# Patient Record
Sex: Male | Born: 1985 | Race: Black or African American | Hispanic: No | Marital: Single | Smoking: Former smoker
Health system: Southern US, Community
[De-identification: ages and names within clinical notes are randomized; demographics above are authoritative.]

## PROBLEM LIST (undated history)

## (undated) ENCOUNTER — Emergency Department (HOSPITAL_COMMUNITY): Payer: No Typology Code available for payment source | Source: Home / Self Care

## (undated) DIAGNOSIS — F29 Unspecified psychosis not due to a substance or known physiological condition: Secondary | ICD-10-CM

---

## 2008-03-19 ENCOUNTER — Emergency Department (HOSPITAL_COMMUNITY): Admission: EM | Admit: 2008-03-19 | Discharge: 2008-03-19 | Payer: Self-pay | Admitting: Emergency Medicine

## 2008-11-25 ENCOUNTER — Ambulatory Visit: Payer: Self-pay | Admitting: Diagnostic Radiology

## 2008-11-25 ENCOUNTER — Emergency Department (HOSPITAL_BASED_OUTPATIENT_CLINIC_OR_DEPARTMENT_OTHER): Admission: EM | Admit: 2008-11-25 | Discharge: 2008-11-25 | Payer: Self-pay | Admitting: Emergency Medicine

## 2011-02-23 LAB — COMPREHENSIVE METABOLIC PANEL
ALT: 6 U/L (ref 0–53)
AST: 23 U/L (ref 0–37)
Albumin: 4.8 g/dL (ref 3.5–5.2)
Alkaline Phosphatase: 44 U/L (ref 39–117)
Calcium: 9.1 mg/dL (ref 8.4–10.5)
GFR calc Af Amer: >60 mL/min (ref >60)
Glucose, Bld: 95 mg/dL (ref 70–99)
Potassium: 4.2 mEq/L (ref 3.5–5.1)
Sodium: 141 mEq/L (ref 135–145)
Total Protein: 7.8 g/dL (ref 6.0–8.3)

## 2011-02-23 LAB — DIFFERENTIAL
Basophils Relative: 3 % — ABNORMAL HIGH (ref 0–1)
Eosinophils Absolute: 0.1 10*3/uL (ref 0.0–0.7)
Eosinophils Relative: 1 % (ref 0–5)
Lymphs Abs: 2.5 10*3/uL (ref 0.7–4.0)
Monocytes Absolute: 0.5 10*3/uL (ref 0.1–1.0)
Monocytes Relative: 6 % (ref 3–12)

## 2011-02-23 LAB — CBC
Hemoglobin: 14.4 g/dL (ref 13.0–17.0)
Platelets: 253 10*3/uL (ref 150–400)
RDW: 12.9 % (ref 11.5–15.5)

## 2011-02-23 LAB — POCT TOXICOLOGY PANEL: Tetrahydrocannabinol: POSITIVE

## 2011-02-23 LAB — URINALYSIS, ROUTINE W REFLEX MICROSCOPIC
Bilirubin Urine: NEGATIVE
Glucose, UA: NEGATIVE mg/dL
Protein, ur: NEGATIVE mg/dL
Urobilinogen, UA: 0.2 mg/dL (ref 0.0–1.0)

## 2011-02-23 LAB — URINE MICROSCOPIC-ADD ON

## 2011-02-23 LAB — ETHANOL: Alcohol, Ethyl (B): 176 mg/dL — ABNORMAL HIGH (ref 0–10)

## 2014-01-20 ENCOUNTER — Emergency Department (HOSPITAL_COMMUNITY)
Admission: EM | Admit: 2014-01-20 | Discharge: 2014-01-21 | Disposition: A | Discharge status: Home / Self Care | Payer: Self-pay | Attending: Emergency Medicine | Admitting: Emergency Medicine

## 2014-01-20 ENCOUNTER — Encounter (HOSPITAL_COMMUNITY): Payer: Self-pay | Admitting: Emergency Medicine

## 2014-01-20 DIAGNOSIS — Z202 Contact with and (suspected) exposure to infections with a predominantly sexual mode of transmission: Secondary | ICD-10-CM

## 2014-01-20 DIAGNOSIS — Z113 Encounter for screening for infections with a predominantly sexual mode of transmission: Secondary | ICD-10-CM | POA: Insufficient documentation

## 2014-01-20 DIAGNOSIS — L539 Erythematous condition, unspecified: Secondary | ICD-10-CM | POA: Insufficient documentation

## 2014-01-20 MED ORDER — METRONIDAZOLE 500 MG PO TABS: 2000.0000 mg | ORAL_TABLET | Freq: Once | ORAL | Status: DC

## 2014-01-20 MED ORDER — AZITHROMYCIN 250 MG PO TABS
1000.0000 mg | ORAL_TABLET | Freq: Once | ORAL | Status: AC
  Administered 2014-01-21: 1000 mg via ORAL
  Filled 2014-01-20: qty 4

## 2014-01-20 MED ORDER — CEFTRIAXONE SODIUM 250 MG IJ SOLR
250.0000 mg | Freq: Once | INTRAMUSCULAR | Status: AC
  Administered 2014-01-21: 250 mg via INTRAMUSCULAR
  Filled 2014-01-20: qty 250
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
## 2014-01-20 NOTE — Discharge Instructions (Signed)
You were seen and evaluated for possible STD exposure. Your lab testing will be back in 2-3 days. You were given treatments for gonorrhea, Chlamydia and a prescription for medicine to treat possible trichomonas. Please take the antibiotics at home for possible trichomonas as instructed by taking all the pills once. Do not drink alcohol with this medicine. Followup with [facility] for continued evaluation and treatment.    Sexually Transmitted Disease A sexually transmitted disease (STD) is a disease or infection that may be passed (transmitted) from person to person, usually during sexual activity. This may happen by way of saliva, semen, blood, vaginal mucus, or urine. Common STDs include:   Gonorrhea.   Chlamydia.   Syphilis.   HIV and AIDS.   Genital herpes.   Hepatitis B and C.   Trichomonas.   Human papillomavirus (HPV).   Pubic lice.   Scabies.  Mites.  Bacterial vaginosis. WHAT ARE CAUSES OF STDs? An STD may be caused by bacteria, a virus, or parasites. STDs are often transmitted during sexual activity if one person is infected. However, they may also be transmitted through nonsexual means. STDs may be transmitted after:   Sexual intercourse with an infected person.   Sharing sex toys with an infected person.   Sharing needles with an infected person or using unclean piercing or tattoo needles.  Having intimate contact with the genitals, mouth, or rectal areas of an infected person.   Exposure to infected fluids during birth. WHAT ARE THE SIGNS AND SYMPTOMS OF STDs? Different STDs have different symptoms. Some people may not have any symptoms. If symptoms are present, they may include:   Painful or bloody urination.   Pain in the pelvis, abdomen, vagina, anus, throat, or eyes.   Skin rash, itching, irritation, growths, sores (lesions), ulcerations, or warts in the genital or anal area.  Abnormal vaginal discharge with or  without bad odor.   Penile discharge in men.   Fever.   Pain or bleeding during sexual intercourse.   Swollen glands in the groin area.   Yellow skin and eyes (jaundice). This is seen with hepatitis.   Swollen testicles.  Infertility.  Sores and blisters in the mouth. HOW ARE STDs DIAGNOSED? To make a diagnosis, your health care provider may:   Take a medical history.   Perform a physical exam.   Take a sample of any discharge for examination.  Swab the throat, cervix, opening to the penis, rectum, or vagina for testing.  Test a sample of your first morning urine.   Perform blood tests.   Perform a Pap smear, if this applies.   Perform a colposcopy.   Perform a laparoscopy.  HOW ARE STDs TREATED? Treatment depends on the STD. Some STDs may be treated but not cured.   Chlamydia, gonorrhea, trichomonas, and syphilis can be cured with antibiotics.   Genital herpes, hepatitis, and HIV can be treated, but not cured, with prescribed medicines. The medicines lessen symptoms.   Genital warts from HPV can be treated with medicine or by freezing, burning (electrocautery), or surgery. Warts may come back.   HPV cannot be cured with medicine or surgery. However, abnormal areas may be removed from the cervix, vagina, or vulva.   If your diagnosis is confirmed, your recent sexual partners need treatment. This is true even if they are symptom-free or have a negative culture or evaluation. They should not have sex until their health care providers say it is OK. HOW CAN I REDUCE  MY RISK OF GETTING AN STD?  Use latex condoms, dental dams, and water-soluble lubricants during sexual activity. Do not use petroleum jelly or oils.  Get vaccinated for HPV and hepatitis. If you have not received these vaccines in the past, talk to your health care provider about whether one or both might be right for you.   Avoid risky sex practices that can break the skin.  WHAT  SHOULD I DO IF I THINK I HAVE AN STD?  See your health care provider.   Inform all sexual partners. They should be tested and treated for any STDs.  Do not have sex until your health care provider says it is OK. WHEN SHOULD I GET HELP? Seek immediate medical care if:  You develop severe abdominal pain.  You are a man and notice swelling or pain in the testicles.  You are a woman and notice swelling or pain in your vagina. Document Released: 01/16/2003 Document Revised: 08/16/2013 Document Reviewed: 05/16/2013 [facility] Patient Information 2014 [place], [place].

## 2014-01-22 NOTE — ED Provider Notes (Signed)
Medical screening examination/treatment/procedure(s) were performed by non-physician practitioner and as supervising physician I was immediately available for consultation/collaboration.   EKG Interpretation None        Suzi RootsKevin E Quintavius Niebuhr, MD 01/22/14 [phone]

## 2014-01-25 LAB — GC/CHLAMYDIA PROBE AMP
CT Probe RNA: POSITIVE — AB
GC PROBE AMP APTIMA: POSITIVE — AB

## 2014-01-26 NOTE — ED Notes (Signed)
+   Chlamydia + Gonorrhea Patient treated with Rocephin And Zithromax-[facility] faxed 

## 2014-01-27 ENCOUNTER — Telehealth (HOSPITAL_BASED_OUTPATIENT_CLINIC_OR_DEPARTMENT_OTHER): Payer: Self-pay | Admitting: Emergency Medicine

## 2014-01-30 NOTE — ED Notes (Signed)
Unable to contact via phone.'Letter sent to [facility] address. 

## 2014-04-30 ENCOUNTER — Emergency Department (HOSPITAL_BASED_OUTPATIENT_CLINIC_OR_DEPARTMENT_OTHER)
Admission: EM | Admit: 2014-04-30 | Discharge: 2014-04-30 | Disposition: A | Discharge status: Home / Self Care | Payer: Self-pay | Attending: Emergency Medicine | Admitting: Emergency Medicine

## 2014-04-30 ENCOUNTER — Encounter (HOSPITAL_BASED_OUTPATIENT_CLINIC_OR_DEPARTMENT_OTHER): Payer: Self-pay | Admitting: Emergency Medicine

## 2014-04-30 DIAGNOSIS — S81811A Laceration without foreign body, right lower leg, initial encounter: Secondary | ICD-10-CM

## 2014-04-30 DIAGNOSIS — Z23 Encounter for immunization: Secondary | ICD-10-CM | POA: Insufficient documentation

## 2014-04-30 DIAGNOSIS — Y9389 Activity, other specified: Secondary | ICD-10-CM | POA: Insufficient documentation

## 2014-04-30 DIAGNOSIS — S81809A Unspecified open wound, unspecified lower leg, initial encounter: Principal | ICD-10-CM

## 2014-04-30 DIAGNOSIS — Y9289 Other specified places as the place of occurrence of the external cause: Secondary | ICD-10-CM | POA: Insufficient documentation

## 2014-04-30 DIAGNOSIS — W261XXA Contact with sword or dagger, initial encounter: Secondary | ICD-10-CM

## 2014-04-30 DIAGNOSIS — S81009A Unspecified open wound, unspecified knee, initial encounter: Secondary | ICD-10-CM | POA: Insufficient documentation

## 2014-04-30 DIAGNOSIS — W260XXA Contact with knife, initial encounter: Secondary | ICD-10-CM | POA: Insufficient documentation

## 2014-04-30 DIAGNOSIS — S91009A Unspecified open wound, unspecified ankle, initial encounter: Principal | ICD-10-CM

## 2014-04-30 MED ORDER — TETANUS-DIPHTH-ACELL PERTUSSIS 5-2.5-18.5 LF-MCG/0.5 IM SUSP
0.5000 mL | Freq: Once | INTRAMUSCULAR | Status: AC
  Administered 2014-04-30: 0.5 mL via INTRAMUSCULAR
  Filled 2014-04-30: qty 0.5
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
## 2014-04-30 NOTE — ED Provider Notes (Signed)
Medical screening examination/treatment/procedure(s) were performed by non-physician practitioner and as supervising physician I was immediately available for consultation/collaboration.    Robert L Beaton, MD 04/30/14 1323 

## 2014-04-30 NOTE — ED Notes (Signed)
Pt upset that we would not write him out of work for a couple of days.  EDP spoke with pt as well as Charity fundraiserN.  Pt reports that he is going to a different hospital to be treated.

## 2014-04-30 NOTE — Discharge Instructions (Signed)
Delayed Wound Closure °Sometimes, your health care provider will decide to delay closing a wound for several days. This is done when the wound is badly bruised, dirty, or when it has been several hours since the injury happened. By delaying the closure of your wound, the risk of infection is reduced. Wounds that are closed in 3-7 days after being cleaned up and dressed heal just as well as those that are closed right away. °HOME CARE INSTRUCTIONS °· Rest and elevate the injured area until the pain and swelling are gone. °· Have your wound checked as instructed by your health care provider. °SEEK MEDICAL CARE IF: °· You develop unusual or increased swelling or redness around the wound. °· You have increasing pain or tenderness. °· There is increasing fluid (drainage) or a bad smelling drainage coming from the wound. °Document Released: 10/26/2005 Document Revised: 10/31/2013 Document Reviewed: 04/25/2013 °ExitCare® Patient Information ©2015 [employer], LLC. This information is not intended to replace advice given to you by your health care provider. Make sure you discuss any questions you have with your health care provider. ° °

## 2014-10-13 ENCOUNTER — Encounter (HOSPITAL_BASED_OUTPATIENT_CLINIC_OR_DEPARTMENT_OTHER): Payer: Self-pay | Admitting: Emergency Medicine

## 2014-10-13 ENCOUNTER — Emergency Department (HOSPITAL_BASED_OUTPATIENT_CLINIC_OR_DEPARTMENT_OTHER)
Admission: EM | Admit: 2014-10-13 | Discharge: 2014-10-13 | Disposition: A | Discharge status: Home / Self Care | Payer: Self-pay | Attending: Emergency Medicine | Admitting: Emergency Medicine

## 2014-10-13 DIAGNOSIS — Z792 Long term (current) use of antibiotics: Secondary | ICD-10-CM | POA: Insufficient documentation

## 2014-10-13 DIAGNOSIS — Z202 Contact with and (suspected) exposure to infections with a predominantly sexual mode of transmission: Secondary | ICD-10-CM | POA: Insufficient documentation

## 2014-10-13 MED ORDER — AZITHROMYCIN 250 MG PO TABS
1000.0000 mg | ORAL_TABLET | Freq: Once | ORAL | Status: AC
  Administered 2014-10-13: 1000 mg via ORAL
  Filled 2014-10-13: qty 4

## 2014-10-13 MED ORDER — LIDOCAINE HCL (PF) 1 % IJ SOLN
INTRAMUSCULAR | Status: AC
  Administered 2014-10-13: 14:00:00
  Filled 2014-10-13: qty 5

## 2014-10-13 MED ORDER — CEFTRIAXONE SODIUM 250 MG IJ SOLR
250.0000 mg | Freq: Once | INTRAMUSCULAR | Status: AC
  Administered 2014-10-13: 250 mg via INTRAMUSCULAR
  Filled 2014-10-13: qty 250

## 2014-10-13 NOTE — ED Provider Notes (Signed)
CSN: [id number]     Arrival date & time 10/13/14  1309 History   First MD Initiated Contact with Patient 10/13/14 1311     Chief Complaint  Patient presents with  . Exposure to STD     (Consider location/radiation/quality/duration/timing/severity/associated sxs/prior Treatment) HPI Comments: Patient presents requesting treatment for gonorrhea. Patient states that his girlfriend/sexual partner was diagnosed with gonorrhea after a routine exam. He denies any symptoms including penile discharge, dysuria, rash, sore throat. No other complaints.  Patient is a 28 y.o. male presenting with STD exposure. The history is provided by the patient.  Exposure to STD Pertinent negatives include no arthralgias, fever, rash or sore throat.    History reviewed. No pertinent past medical history. History reviewed. No pertinent past surgical history. No family history on file. History  Substance Use Topics  . Smoking status: Never Smoker   . Smokeless tobacco: Not on file  . Alcohol Use: No    Review of Systems  Constitutional: Negative for fever.  HENT: Negative for sore throat.   Eyes: Negative for discharge.  Gastrointestinal: Negative for rectal pain.  Genitourinary: Negative for dysuria, frequency, discharge, genital sores, penile pain and testicular pain.  Musculoskeletal: Negative for arthralgias.  Skin: Negative for rash.  Hematological: Negative for adenopathy.      Allergies  Review of patient's allergies indicates no known allergies.  Home Medications   Prior to Admission medications   Medication Sig Start Date End Date Taking? Authorizing Provider  ibuprofen (ADVIL,MOTRIN) 200 MG tablet Take 400 mg by mouth every 6 (six) hours as needed for fever or moderate pain.    Historical Provider, MD  metroNIDAZOLE (FLAGYL) 500 MG tablet Take 4 tablets (2,000 mg total) by mouth once. 01/20/14   Phill MutterPeter S Dammen, PA-C   BP 121/83 mmHg  Pulse 97  Temp(Src) 97.7 F (36.5 C) (Oral)  Resp  18  Ht 5\' 10"  (1.778 m)  Wt 165 lb (74.844 kg)  BMI 23.68 kg/m2  SpO2 100%   Physical Exam  Constitutional: He appears well-developed and well-nourished.  HENT:  Head: Normocephalic and atraumatic.  Eyes: Conjunctivae are normal.  Neck: Normal range of motion. Neck supple.  Pulmonary/Chest: No respiratory distress.  Genitourinary: Penis normal. Right testis shows no tenderness. Left testis shows no tenderness. No discharge found.  Lymphadenopathy:       Right: No inguinal adenopathy present.       Left: No inguinal adenopathy present.  Neurological: He is alert.  Skin: Skin is warm and dry.  Psychiatric: He has a normal mood and affect.  Nursing note and vitals reviewed.   ED Course  Procedures (including critical care time) Labs Review Labs Reviewed  GC/CHLAMYDIA PROBE AMP    Imaging Review No results found.   EKG Interpretation None       1:52 PM Patient seen and examined. Work-up initiated. Medications ordered. Patient declines RPR and HIV testing.  Vital signs reviewed and are as follows: BP 121/83 mmHg  Pulse 97  Temp(Src) 97.7 F (36.5 C) (Oral)  Resp 18  Ht 5\' 10"  (1.778 m)  Wt 165 lb (74.844 kg)  BMI 23.68 kg/m2  SpO2 100%  Will test and treat for STD exposure. Patient counseled on safe sexual practices. Told them that they should not have sexual contact for next 7 days and that they need to inform sexual partners so that they can get tested and treated as well. Urged f/u with [place] [facility] for HIV and syphilis testing.  Patient verbalizes understanding and agrees with plan.     MDM   Final diagnoses:  Exposure to STD   Patient with exposure to gonorrhea. Urine probe sent. Patient treated with Rocephin and azithromycin.    Renne CriglerJoshua Lakota Markgraf, PA-C 10/13/14 1355  Audree CamelScott T Goldston, MD 10/14/14 [phone]

## 2014-10-13 NOTE — ED Notes (Signed)
PT presents to ED states his girlfriend was diagnosed  with gonorrhea and he wants to get treated.

## 2014-10-13 NOTE — Discharge Instructions (Signed)
Please read and follow all provided instructions.  Your diagnoses today include:  1. Exposure to STD     Tests performed today include:  Test for gonorrhea and chlamydia. You will be notified by telephone if you have a positive result.  Vital signs. See below for your results today.   Medications:  You were treated for chlamydia (1 gram azithromycin pills) and gonorrhea (250mg  rocephin shot).  Home care instructions:  Read educational materials contained in this packet and follow any instructions provided.   You should tell your partners about your infection and avoid having sex for one week to allow time for the medicine to work.  Follow-up instructions: You [facility] to be tested for HIV, syphilis, and hepatitis -- all of which can be transmitted by sexual contact. We do not routinely screen for these in the Emergency Department.  STD Testing:  [facility] [place] [place] [facility]  [address], [place], phone [phone] or [phone]    Monday - Friday, call for an appointment  [facility] [place] [place] [facility]  [address], [place], phone [phone] or [phone]   Monday - Friday, call for an appointment  Return instructions:   Please return to the Emergency Department if you experience worsening symptoms.   Please return if you have any other emergent concerns.  Additional Information:  Your vital signs today were: BP 121/83 mmHg   Pulse 97   Temp(Src) 97.7 F (36.5 C) (Oral)   Resp 18   Ht 5\' 10"  (1.778 m)   Wt 165 lb (74.844 kg)   BMI 23.68 kg/m2   SpO2 100% If your blood pressure (BP) was elevated above 135/85 this visit, please have this repeated by your doctor within one month. --------------

## 2014-10-13 NOTE — ED Notes (Signed)
Additional orders placed per lab request

## 2014-10-19 LAB — NEISSERIA GONORRHOEAE, PROBE AMP: GC Probe RNA: POSITIVE — AB

## 2014-10-19 LAB — CHLAMYDIA TRACHOMATIS, PROBE AMP: CT Probe RNA: POSITIVE — AB

## 2014-10-20 ENCOUNTER — Telehealth: Payer: Self-pay | Admitting: *Deleted

## 2014-11-04 ENCOUNTER — Telehealth (HOSPITAL_COMMUNITY): Payer: Self-pay

## 2014-11-04 NOTE — ED Notes (Signed)
Unable to contact pt by mail or telephone. Unable to communicate lab results or treatment changes. 

## 2014-11-13 ENCOUNTER — Telehealth (HOSPITAL_BASED_OUTPATIENT_CLINIC_OR_DEPARTMENT_OTHER): Payer: Self-pay | Admitting: Emergency Medicine

## 2014-11-13 NOTE — Telephone Encounter (Signed)
Patient lost to followup, letter sent unable to forward

## 2016-05-22 ENCOUNTER — Emergency Department (HOSPITAL_BASED_OUTPATIENT_CLINIC_OR_DEPARTMENT_OTHER)
Admission: EM | Admit: 2016-05-22 | Discharge: 2016-05-22 | Disposition: A | Discharge status: Home / Self Care | Payer: Self-pay | Attending: Emergency Medicine | Admitting: Emergency Medicine

## 2016-05-22 ENCOUNTER — Encounter (HOSPITAL_BASED_OUTPATIENT_CLINIC_OR_DEPARTMENT_OTHER): Payer: Self-pay | Admitting: *Deleted

## 2016-05-22 DIAGNOSIS — Z202 Contact with and (suspected) exposure to infections with a predominantly sexual mode of transmission: Secondary | ICD-10-CM | POA: Insufficient documentation

## 2016-05-22 LAB — URINE MICROSCOPIC-ADD ON: Squamous Epithelial / LPF: NONE SEEN

## 2016-05-22 LAB — URINALYSIS, ROUTINE W REFLEX MICROSCOPIC
Bilirubin Urine: NEGATIVE
GLUCOSE, UA: NEGATIVE mg/dL
HGB URINE DIPSTICK: NEGATIVE
Ketones, ur: NEGATIVE mg/dL
Nitrite: NEGATIVE
Protein, ur: NEGATIVE mg/dL
SPECIFIC GRAVITY, URINE: 1.022 (ref 1.005–1.030)
pH: 6 (ref 5.0–8.0)

## 2016-05-22 MED ORDER — AZITHROMYCIN 250 MG PO TABS
1000.0000 mg | ORAL_TABLET | Freq: Once | ORAL | Status: AC
  Administered 2016-05-22: 1000 mg via ORAL
  Filled 2016-05-22: qty 4

## 2016-05-22 MED ORDER — CEFTRIAXONE SODIUM 250 MG IJ SOLR
250.0000 mg | Freq: Once | INTRAMUSCULAR | Status: AC
  Administered 2016-05-22: 250 mg via INTRAMUSCULAR
  Filled 2016-05-22: qty 250

## 2016-05-22 MED ORDER — LIDOCAINE HCL (PF) 1 % IJ SOLN
INTRAMUSCULAR | Status: AC
  Administered 2016-05-22: 5 mL
  Filled 2016-05-22: qty 5

## 2016-05-22 NOTE — ED Provider Notes (Signed)
CSN: [id number]     Arrival date & time 05/22/16  1604 History  By signing my name below, I, Soijett Blue, attest that this documentation has been prepared under the direction and in the presence of Melburn Hake, PA-C Electronically Signed: Soijett Blue, ED Scribe. 05/22/2016. 6:27 PM.   No chief complaint on file.    The history is provided by the patient. No language interpreter was used.    Derrick Shepherd is a 30 y.o. male who presents to the Emergency Department complaining of exposure to STD onset today. Denies any pain or complaints at this time. Pt notes that his girlfriend tested positive for gonorrhea and informed him today that he would need to be tested. Pt reports that he has 2 current sexual partners and he has informed his other sexual partner so that she can be tested as well. Pt notes that he intermittently uses condoms during sexual intercourse. Denies any known sexual contact with a HIV infected individual or other recent STD exposure.  He denies penile discharge/pain/swelling, testicular pain/swelling, abdominal pain, n/v, dysuria, hematuria, rash, and any other symptoms. Denies allergies to any medications.    History reviewed. No pertinent past medical history. No past surgical history on file. No family history on file. Social History  Substance Use Topics  . Smoking status: Never Smoker   . Smokeless tobacco: None  . Alcohol Use: No    Review of Systems  Gastrointestinal: Negative for nausea, vomiting and abdominal pain.  Genitourinary: Negative for dysuria, hematuria, discharge, penile swelling, scrotal swelling, difficulty urinating, genital sores, penile pain and testicular pain.  Skin: Negative for color change and rash.     Allergies  Review of patient's allergies indicates no known allergies.  Home Medications   Prior to Admission medications   Medication Sig Start Date End Date Taking? Authorizing Provider  ibuprofen (ADVIL,MOTRIN) 200 MG tablet Take  400 mg by mouth every 6 (six) hours as needed for fever or moderate pain.    Historical Provider, MD  metroNIDAZOLE (FLAGYL) 500 MG tablet Take 4 tablets (2,000 mg total) by mouth once. 01/20/14   Peter Dammen, PA-C   BP 121/71 mmHg  Pulse 71  Temp(Src) 98.4 F (36.9 C) (Oral)  Resp 16  Ht  (1.778 m)  Wt 77.111 kg  BMI 24.39 kg/m2  SpO2 99% Physical Exam  Constitutional: He is oriented to person, place, and time. He appears well-developed and well-nourished.  HENT:  Head: Normocephalic and atraumatic.  Mouth/Throat: Oropharynx is clear and moist. No oropharyngeal exudate.  Eyes: Conjunctivae and EOM are normal. Right eye exhibits no discharge. Left eye exhibits no discharge. No scleral icterus.  Neck: Normal range of motion. Neck supple.  Cardiovascular: Normal rate, regular rhythm and normal heart sounds.  Exam reveals no gallop and no friction rub.   No murmur heard. Pulmonary/Chest: Effort normal and breath sounds normal. No respiratory distress. He has no wheezes. He has no rales. He exhibits no tenderness.  Abdominal: Soft. He exhibits no distension. There is no tenderness. Hernia confirmed negative in the right inguinal area and confirmed negative in the left inguinal area.  Genitourinary: Testes normal and penis normal. Right testis shows no mass, no swelling and no tenderness. Left testis shows no mass, no swelling and no tenderness. Circumcised. No phimosis, paraphimosis, hypospadias, penile erythema or penile tenderness. No discharge found.  Chaperone present for [id number]: Normal range of motion. He exhibits no edema.  Lymphadenopathy:  Right: No inguinal adenopathy present.       Left: No inguinal adenopathy present.  Neurological: He is alert and oriented to person, place, and time.  Nursing note and vitals reviewed.   ED Course  Procedures (including critical care time) DIAGNOSTIC STUDIES: Oxygen Saturation is 99% on RA, nl by my interpretation.     COORDINATION OF CARE: 6:23 PM Discussed treatment plan with pt at bedside which includes rocephin and zithromax and pt agreed to plan.  6:21 PM-Recommended HIV antibody, GC/chlamydia probe, and RPR testing to which the pt declined. Pt informed of the risk factors, to which he states that he is aware of the risk factors.  Labs Review Labs Reviewed  URINALYSIS, ROUTINE W REFLEX MICROSCOPIC (NOT AT [facility]) - Abnormal; Notable for the following:    Leukocytes, UA SMALL (*)    All other components within normal limits  URINE MICROSCOPIC-ADD ON - Abnormal; Notable for the following:    Bacteria, UA RARE (*)    All other components within normal limits  GC/CHLAMYDIA PROBE AMP ([place]) NOT AT [facility]    I have personally reviewed and evaluated these lab results as part of my medical decision-making.  MDM   Final diagnoses:  Exposure to STD    Patient treated in the ED for STI with rocephin and zithromax. Patient advised to inform and treat all sexual partners. Pt advised on safe sex practices and understands that they have GC/Chlamydia cultures pending and will result in 2-3 days. Recommended HIV antibody and RPR testing to which the pt declined. HIV and RPR not sent due to pt refusing. Pt informed of the risk factors, to which he states that he is aware of the risk factors and repeatedly declined testing. UA unremarkable. Pt encouraged to follow up at local health department for future STI checks. No concern for prostatitis or epididymitis. Discussed return precautions. Pt appears safe for discharge.   I personally performed the services described in this documentation, which was scribed in my presence. The recorded information has been reviewed and is accurate.    Satira Sarkicole Elizabeth Light OakNadeau, [place] JerseyPA-C 05/22/16 1840  Vanetta MuldersScott Zackowski, MD 05/22/16 [id number]

## 2016-05-22 NOTE — Discharge Instructions (Signed)
1. Medications: usual home medications °2. Treatment: rest, drink plenty of fluids, use a condom with every sexual encounter °3. Follow Up: Please followup with your primary doctor in 3 days for discussion of your diagnoses and further evaluation after today's visit; if you do not have a primary care doctor use the resource guide provided to find one; Please return to the ER for worsening symptoms, high fevers or persistent vomiting. ° °You have been tested for HIV, syphilis, chlamydia and gonorrhea.  These results will be available in approximately 3 days.  Please inform all sexual partners if you test positive for any of these diseases.  ° °[facility] [place] [facility] tests for every STD. There are 9 other clinics in [place], but only 3 of them offer such comprehensive testing. ° °Services Offered: °Testing Services  °Chlamydia Testing °Conventional Blood HIV Testing °Gonorrhea Testing °Hepatitis B Testing °Hepatitis C Testing °Herpes Testing °Syphilis Testing °TB Testing ° °Vaccines And Treatments:  °Hepatitis B Vaccine °Hepatitis A Vaccine °HPV Vaccine °TB Treatment °Gynecological Care °Family Planning ° °Prevention Services:  °HIV Test Counseling °HIV/AIDS Prevention Education °Safer Sex Education °[employer] [place] °STD Prevention/Education °TB Prevention Education ° °Languages Spoken: °English °Spanish ° °Hours of Operation: °Note: Please contact the organization for hours of operation. Disclaimer: Hours of peration change frequently. Please contact the organization to verify. °Appointment Required: Yes ° °Contact Information: °Phone [phone] °Other Phones [phone] (Domestic Fax) ° °Address: °[address] °, [place] [address] ° °Website: [email or web address]  °

## 2016-05-22 NOTE — ED Notes (Signed)
Std exposure. No symptoms.

## 2016-05-25 LAB — GC/CHLAMYDIA PROBE AMP (~~LOC~~) NOT AT ARMC
CHLAMYDIA, DNA PROBE: POSITIVE — AB
NEISSERIA GONORRHEA: POSITIVE — AB

## 2016-05-26 ENCOUNTER — Telehealth (HOSPITAL_BASED_OUTPATIENT_CLINIC_OR_DEPARTMENT_OTHER): Payer: Self-pay

## 2016-05-26 NOTE — Telephone Encounter (Signed)
Spoke with pt. Verified ID. Informed of labs. Treated per protocol. [employer] form faxed. Pt informed to abstain from sexual activity x 10 days and to notify partner for testing and treatment.  

## 2016-08-24 ENCOUNTER — Emergency Department (HOSPITAL_BASED_OUTPATIENT_CLINIC_OR_DEPARTMENT_OTHER): Payer: No Typology Code available for payment source

## 2016-08-24 ENCOUNTER — Encounter (HOSPITAL_BASED_OUTPATIENT_CLINIC_OR_DEPARTMENT_OTHER): Payer: Self-pay

## 2016-08-24 ENCOUNTER — Emergency Department (HOSPITAL_BASED_OUTPATIENT_CLINIC_OR_DEPARTMENT_OTHER)
Admission: EM | Admit: 2016-08-24 | Discharge: 2016-08-24 | Disposition: A | Discharge status: Home / Self Care | Payer: No Typology Code available for payment source | Attending: Emergency Medicine | Admitting: Emergency Medicine

## 2016-08-24 DIAGNOSIS — M62838 Other muscle spasm: Secondary | ICD-10-CM | POA: Insufficient documentation

## 2016-08-24 DIAGNOSIS — M549 Dorsalgia, unspecified: Secondary | ICD-10-CM | POA: Insufficient documentation

## 2016-08-24 DIAGNOSIS — M542 Cervicalgia: Secondary | ICD-10-CM | POA: Diagnosis present

## 2016-08-24 DIAGNOSIS — Y939 Activity, unspecified: Secondary | ICD-10-CM | POA: Diagnosis not present

## 2016-08-24 DIAGNOSIS — Y999 Unspecified external cause status: Secondary | ICD-10-CM | POA: Diagnosis not present

## 2016-08-24 DIAGNOSIS — Y92410 Unspecified street and highway as the place of occurrence of the external cause: Secondary | ICD-10-CM | POA: Insufficient documentation
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
## 2017-10-20 ENCOUNTER — Emergency Department (HOSPITAL_COMMUNITY): Payer: Self-pay

## 2017-10-20 ENCOUNTER — Other Ambulatory Visit: Payer: Self-pay

## 2017-10-20 ENCOUNTER — Encounter (HOSPITAL_COMMUNITY): Payer: Self-pay | Admitting: General Practice

## 2017-10-20 ENCOUNTER — Inpatient Hospital Stay (HOSPITAL_COMMUNITY)
Admission: EM | Admit: 2017-10-20 | Discharge: 2017-10-21 | DRG: 988 | Disposition: A | Discharge status: Home / Self Care | Payer: Self-pay | Attending: Family Medicine | Admitting: Family Medicine

## 2017-10-20 DIAGNOSIS — S80211A Abrasion, right knee, initial encounter: Secondary | ICD-10-CM | POA: Diagnosis present

## 2017-10-20 DIAGNOSIS — G928 Other toxic encephalopathy: Secondary | ICD-10-CM | POA: Diagnosis present

## 2017-10-20 DIAGNOSIS — E872 Acidosis, unspecified: Secondary | ICD-10-CM

## 2017-10-20 DIAGNOSIS — E875 Hyperkalemia: Secondary | ICD-10-CM | POA: Diagnosis present

## 2017-10-20 DIAGNOSIS — S80212A Abrasion, left knee, initial encounter: Secondary | ICD-10-CM | POA: Diagnosis present

## 2017-10-20 DIAGNOSIS — R4182 Altered mental status, unspecified: Secondary | ICD-10-CM

## 2017-10-20 DIAGNOSIS — G92 Toxic encephalopathy: Principal | ICD-10-CM | POA: Diagnosis present

## 2017-10-20 DIAGNOSIS — N179 Acute kidney failure, unspecified: Secondary | ICD-10-CM | POA: Diagnosis present

## 2017-10-20 DIAGNOSIS — T68XXXA Hypothermia, initial encounter: Secondary | ICD-10-CM | POA: Diagnosis present

## 2017-10-20 DIAGNOSIS — D62 Acute posthemorrhagic anemia: Secondary | ICD-10-CM | POA: Diagnosis not present

## 2017-10-20 DIAGNOSIS — W134XXA Fall from, out of or through window, initial encounter: Secondary | ICD-10-CM | POA: Diagnosis present

## 2017-10-20 DIAGNOSIS — T07XXXA Unspecified multiple injuries, initial encounter: Secondary | ICD-10-CM

## 2017-10-20 DIAGNOSIS — F29 Unspecified psychosis not due to a substance or known physiological condition: Secondary | ICD-10-CM

## 2017-10-20 DIAGNOSIS — R410 Disorientation, unspecified: Secondary | ICD-10-CM

## 2017-10-20 DIAGNOSIS — Z23 Encounter for immunization: Secondary | ICD-10-CM

## 2017-10-20 DIAGNOSIS — R404 Transient alteration of awareness: Secondary | ICD-10-CM

## 2017-10-20 HISTORY — DX: Unspecified psychosis not due to a substance or known physiological condition: F29

## 2017-10-20 LAB — I-STAT CHEM 8, ED
BUN: 11 mg/dL (ref 6–20)
CHLORIDE: 106 mmol/L (ref 101–111)
Calcium, Ion: 1.12 mmol/L — ABNORMAL LOW (ref 1.15–1.40)
Creatinine, Ser: 1.1 mg/dL (ref 0.61–1.24)
Glucose, Bld: 227 mg/dL — ABNORMAL HIGH (ref 65–99)
HCT: 42 % (ref 39.0–52.0)
HEMOGLOBIN: 14.3 g/dL (ref 13.0–17.0)
Potassium: 5.9 mmol/L — ABNORMAL HIGH (ref 3.5–5.1)
Sodium: 139 mmol/L (ref 135–145)
TCO2: 19 mmol/L — ABNORMAL LOW (ref 22–32)

## 2017-10-20 LAB — CBC WITH DIFFERENTIAL/PLATELET
BASOS PCT: 0 %
Basophils Absolute: 0 10*3/uL (ref 0.0–0.1)
EOS ABS: 0.1 10*3/uL (ref 0.0–0.7)
EOS PCT: 0 %
HCT: 41.7 % (ref 39.0–52.0)
Hemoglobin: 13.7 g/dL (ref 13.0–17.0)
LYMPHS ABS: 3.4 10*3/uL (ref 0.7–4.0)
Lymphocytes Relative: 24 %
MCH: 29.8 pg (ref 26.0–34.0)
MCHC: 32.9 g/dL (ref 30.0–36.0)
MCV: 90.7 fL (ref 78.0–100.0)
Monocytes Absolute: 1.1 10*3/uL — ABNORMAL HIGH (ref 0.1–1.0)
Monocytes Relative: 8 %
NEUTROS PCT: 68 %
Neutro Abs: 9.5 10*3/uL — ABNORMAL HIGH (ref 1.7–7.7)
PLATELETS: 405 10*3/uL — AB (ref 150–400)
RBC: 4.6 MIL/uL (ref 4.22–5.81)
RDW: 13.2 % (ref 11.5–15.5)
WBC: 14.1 10*3/uL — AB (ref 4.0–10.5)

## 2017-10-20 LAB — COMPREHENSIVE METABOLIC PANEL
ALBUMIN: 4.1 g/dL (ref 3.5–5.0)
ALT: 18 U/L (ref 17–63)
ANION GAP: 17 — AB (ref 5–15)
AST: 50 U/L — ABNORMAL HIGH (ref 15–41)
Alkaline Phosphatase: 49 U/L (ref 38–126)
BUN: 10 mg/dL (ref 6–20)
CALCIUM: 8.7 mg/dL — AB (ref 8.9–10.3)
CHLORIDE: 104 mmol/L (ref 101–111)
CO2: 15 mmol/L — AB (ref 22–32)
CREATININE: 1.33 mg/dL — AB (ref 0.61–1.24)
GFR calc non Af Amer: >60 mL/min (ref >60)
GLUCOSE: 226 mg/dL — AB (ref 65–99)
Potassium: 5.8 mmol/L — ABNORMAL HIGH (ref 3.5–5.1)
Sodium: 136 mmol/L (ref 135–145)
Total Bilirubin: 0.9 mg/dL (ref 0.3–1.2)
Total Protein: 7.1 g/dL (ref 6.5–8.1)

## 2017-10-20 LAB — URINALYSIS, ROUTINE W REFLEX MICROSCOPIC
BACTERIA UA: NONE SEEN
Bilirubin Urine: NEGATIVE
Glucose, UA: 150 mg/dL — AB
Ketones, ur: NEGATIVE mg/dL
Leukocytes, UA: NEGATIVE
Nitrite: NEGATIVE
PH: 5 (ref 5.0–8.0)
Protein, ur: 30 mg/dL — AB
SQUAMOUS EPITHELIAL / LPF: NONE SEEN
Specific Gravity, Urine: 1.014 (ref 1.005–1.030)

## 2017-10-20 LAB — RAPID URINE DRUG SCREEN, HOSP PERFORMED
Amphetamines: NOT DETECTED
Barbiturates: NOT DETECTED
Benzodiazepines: POSITIVE — AB
Cocaine: NOT DETECTED
Opiates: NOT DETECTED
Tetrahydrocannabinol: POSITIVE — AB

## 2017-10-20 LAB — CBG MONITORING, ED: GLUCOSE-CAPILLARY: 225 mg/dL — AB (ref 65–99)

## 2017-10-20 LAB — SALICYLATE LEVEL

## 2017-10-20 LAB — I-STAT CG4 LACTIC ACID, ED
LACTIC ACID, VENOUS: 11.34 mmol/L — AB (ref 0.5–1.9)
LACTIC ACID, VENOUS: 2.05 mmol/L — AB (ref 0.5–1.9)

## 2017-10-20 LAB — ACETAMINOPHEN LEVEL

## 2017-10-20 LAB — ETHANOL

## 2017-10-20 MED ORDER — SODIUM CHLORIDE 0.9 % IV BOLUS (SEPSIS)
1000.0000 mL | Freq: Once | INTRAVENOUS | Status: AC
  Administered 2017-10-20: 1000 mL via INTRAVENOUS

## 2017-10-20 MED ORDER — CEFAZOLIN SODIUM-DEXTROSE 2-4 GM/100ML-% IV SOLN
2.0000 g | Freq: Once | INTRAVENOUS | Status: AC
  Administered 2017-10-20: 2 g via INTRAVENOUS

## 2017-10-20 MED ORDER — SODIUM CHLORIDE 0.9% FLUSH
3.0000 mL | Freq: Two times a day (BID) | INTRAVENOUS | Status: DC
  Administered 2017-10-20: 3 mL via INTRAVENOUS

## 2017-10-20 MED ORDER — MIDAZOLAM HCL 2 MG/2ML IJ SOLN
2.0000 mg | Freq: Once | INTRAMUSCULAR | Status: AC
  Administered 2017-10-20: 2 mg via INTRAVENOUS

## 2017-10-20 MED ORDER — LIDOCAINE HCL 2 % IJ SOLN: 20.0000 mL | Freq: Once | INTRAMUSCULAR | Status: DC

## 2017-10-20 MED ORDER — SODIUM CHLORIDE 0.9% FLUSH: 3.0000 mL | INTRAVENOUS | Status: DC | PRN

## 2017-10-20 MED ORDER — CEFAZOLIN SODIUM-DEXTROSE 2-4 GM/100ML-% IV SOLN
1.0000 g | Freq: Once | INTRAVENOUS | Status: DC
  Filled 2017-10-20: qty 100

## 2017-10-20 MED ORDER — ACETAMINOPHEN 650 MG RE SUPP: 650.0000 mg | Freq: Four times a day (QID) | RECTAL | Status: DC | PRN

## 2017-10-20 MED ORDER — MIDAZOLAM HCL 2 MG/2ML IJ SOLN: 2.0000 mg | Freq: Once | INTRAMUSCULAR | Status: DC | PRN

## 2017-10-20 MED ORDER — CEPHALEXIN 500 MG PO CAPS
500.0000 mg | ORAL_CAPSULE | Freq: Three times a day (TID) | ORAL | Status: DC
  Administered 2017-10-20 – 2017-10-21 (×2): 500 mg via ORAL
  Filled 2017-10-20 (×2): qty 1

## 2017-10-20 MED ORDER — MIDAZOLAM HCL 2 MG/2ML IJ SOLN
INTRAMUSCULAR | Status: AC
  Filled 2017-10-20: qty 2

## 2017-10-20 MED ORDER — TETANUS-DIPHTH-ACELL PERTUSSIS 5-2.5-18.5 LF-MCG/0.5 IM SUSP
0.5000 mL | Freq: Once | INTRAMUSCULAR | Status: AC
  Administered 2017-10-20: 0.5 mL via INTRAMUSCULAR
  Filled 2017-10-20: qty 0.5

## 2017-10-20 MED ORDER — HEPARIN SODIUM (PORCINE) 5000 UNIT/ML IJ SOLN
5000.0000 [IU] | Freq: Three times a day (TID) | INTRAMUSCULAR | Status: DC
  Administered 2017-10-20 – 2017-10-21 (×2): 5000 [IU] via SUBCUTANEOUS
  Filled 2017-10-20 (×2): qty 1

## 2017-10-20 MED ORDER — POLYETHYLENE GLYCOL 3350 17 G PO PACK: 17.0000 g | PACK | Freq: Every day | ORAL | Status: DC | PRN

## 2017-10-20 MED ORDER — SODIUM CHLORIDE 0.9 % IV SOLN: 250.0000 mL | INTRAVENOUS | Status: DC | PRN

## 2017-10-20 MED ORDER — SODIUM CHLORIDE 0.9 % IV SOLN
1.0000 g | Freq: Once | INTRAVENOUS | Status: AC
  Administered 2017-10-20: 1 g via INTRAVENOUS
  Filled 2017-10-20 (×2): qty 10

## 2017-10-20 MED ORDER — SODIUM CHLORIDE 0.9 % IV SOLN
INTRAVENOUS | Status: DC
  Administered 2017-10-20: 150 mL/h via INTRAVENOUS
  Administered 2017-10-21 (×2): via INTRAVENOUS

## 2017-10-20 MED ORDER — BACITRACIN-NEOMYCIN-POLYMYXIN OINTMENT TUBE
TOPICAL_OINTMENT | Freq: Three times a day (TID) | CUTANEOUS | Status: DC
  Administered 2017-10-20: 1 via TOPICAL
  Filled 2017-10-20: qty 14.17
  Filled 2017-10-20 (×3): qty 1

## 2017-10-20 MED ORDER — ALBUTEROL SULFATE (2.5 MG/3ML) 0.083% IN NEBU: 2.5000 mg | INHALATION_SOLUTION | RESPIRATORY_TRACT | Status: DC | PRN

## 2017-10-20 MED ORDER — CEPHALEXIN 250 MG PO CAPS: 500.0000 mg | ORAL_CAPSULE | Freq: Three times a day (TID) | ORAL | Status: DC

## 2017-10-20 MED ORDER — ONDANSETRON HCL 4 MG PO TABS: 4.0000 mg | ORAL_TABLET | Freq: Four times a day (QID) | ORAL | Status: DC | PRN

## 2017-10-20 MED ORDER — LIDOCAINE-EPINEPHRINE (PF) 2 %-1:200000 IJ SOLN
20.0000 mL | Freq: Once | INTRAMUSCULAR | Status: AC
  Administered 2017-10-20: 20 mL via INTRADERMAL
  Filled 2017-10-20: qty 20

## 2017-10-20 MED ORDER — ACETAMINOPHEN 325 MG PO TABS
650.0000 mg | ORAL_TABLET | Freq: Four times a day (QID) | ORAL | Status: DC | PRN
  Administered 2017-10-20: 650 mg via ORAL
  Filled 2017-10-20: qty 2

## 2017-10-20 MED ORDER — SODIUM POLYSTYRENE SULFONATE 15 GM/60ML PO SUSP
15.0000 g | Freq: Once | ORAL | Status: AC
  Administered 2017-10-20: 15 g via ORAL
  Filled 2017-10-20: qty 60

## 2017-10-20 MED ORDER — ONDANSETRON HCL 4 MG/2ML IJ SOLN: 4.0000 mg | Freq: Four times a day (QID) | INTRAMUSCULAR | Status: DC | PRN

## 2017-10-20 MED ORDER — SENNA 8.6 MG PO TABS
1.0000 | ORAL_TABLET | Freq: Two times a day (BID) | ORAL | Status: DC
  Administered 2017-10-20 – 2017-10-21 (×2): 8.6 mg via ORAL
  Filled 2017-10-20 (×2): qty 1

## 2017-10-20 MED ORDER — LIDOCAINE HCL 2 % IJ SOLN
20.0000 mL | Freq: Once | INTRAMUSCULAR | Status: DC
  Filled 2017-10-20: qty 20

## 2017-10-20 NOTE — ED Notes (Signed)
Pt speaking on phone with girlfriend.  Pt has no recollection of events of this am.  Pt requesting food.

## 2017-10-20 NOTE — ED Provider Notes (Signed)
[facility] EMERGENCY DEPARTMENT Provider Note   CSN: [id number] Arrival date & time: 10/20/17  0730     History   Chief Complaint Chief Complaint  Patient presents with  . Manic Behavior    HPI Derrick Shepherd is a 31 y.o. male.  HPI Level 5 caveat due to AMS.  Patient brought in by [facility] and EMS for altered mental status/agitation.  According to [employer], patient was out celebrating his birthday yesterday evening per his girlfriend.  He had endorsed marijuana to her.  He was acting weird, and she had asked him to shower, but patient subsequently ran out of the bathroom, started being on the windows and jumped out of the apartment building about 6-7 ft. He then started to fight himself in the snow. Unclear of how long patient was outside for.   No past medical history on file.  Patient Active Problem List   Diagnosis Date Noted  . Altered mental state 10/20/2017    No past surgical history on file.     Home Medications    Prior to Admission medications   Not on File    Family History No family history on file.  Social History Social History   Tobacco Use  . Smoking status: Never Smoker  . Smokeless tobacco: Never Used  Substance Use Topics  . Alcohol use: No  . Drug use: No     Allergies   Patient has no known allergies.   Review of Systems Review of Systems  Unable to perform ROS: Mental status change     Physical Exam Updated Vital Signs BP (!) 90/49   Pulse 88   Temp 97.9 F (36.6 C) (Oral)   Resp 17   Wt 77.6 kg (171 lb)   SpO2 100%   BMI 24.54 kg/m   Physical Exam Physical Exam  Nursing note and vitals reviewed. Constitutional: Intermittently agitated, non-verbal Head: Normocephalic.  Mouth/Throat: Oropharynx is clear and moist.  Neck:  Neck supple.  Cardiovascular: Tachycardic rate and regular rhythm.   Pulmonary/Chest: Effort normal and breath sounds normal.  Abdominal: Soft. There is no tenderness. There is no  rebound and no guarding.  Musculoskeletal: Normal range of motion.  Neurological: Somnolent, then intermittently agitated, pupils 4mm symmetric, minimally reactive to light, no facial droop, moves all 4 extremities spontaneously Skin: Skin is cool. Burn like wounds to knees and left elbow.  Lacerations noted to the right palm, right elbow, and right foot Psychiatric: Cooperative   ED Treatments / Results  Labs (all labs ordered are listed, but only abnormal results are displayed) Labs Reviewed  CBC WITH DIFFERENTIAL/PLATELET - Abnormal; Notable for the following components:      Result Value   WBC 14.1 (*)    Platelets 405 (*)    Neutro Abs 9.5 (*)    Monocytes Absolute 1.1 (*)    All other components within normal limits  COMPREHENSIVE METABOLIC PANEL - Abnormal; Notable for the following components:   Potassium 5.8 (*)    CO2 15 (*)    Glucose, Bld 226 (*)    Creatinine, Ser 1.33 (*)    Calcium 8.7 (*)    AST 50 (*)    Anion gap 17 (*)    All other components within normal limits  URINALYSIS, ROUTINE W REFLEX MICROSCOPIC - Abnormal; Notable for the following components:   Glucose, UA 150 (*)    Hgb urine dipstick SMALL (*)    Protein, ur 30 (*)  All other components within normal limits  ACETAMINOPHEN LEVEL - Abnormal; Notable for the following components:   Acetaminophen (Tylenol), Serum <10 (*)    All other components within normal limits  RAPID URINE DRUG SCREEN, HOSP PERFORMED - Abnormal; Notable for the following components:   Benzodiazepines POSITIVE (*)    Tetrahydrocannabinol POSITIVE (*)    All other components within normal limits  CBG MONITORING, ED - Abnormal; Notable for the following components:   Glucose-Capillary 225 (*)    All other components within normal limits  I-STAT CHEM 8, ED - Abnormal; Notable for the following components:   Potassium 5.9 (*)    Glucose, Bld 227 (*)    Calcium, Ion 1.12 (*)    TCO2 19 (*)    All other components within  normal limits  I-STAT CG4 LACTIC ACID, ED - Abnormal; Notable for the following components:   Lactic Acid, Venous 11.34 (*)    All other components within normal limits  I-STAT CG4 LACTIC ACID, ED - Abnormal; Notable for the following components:   Lactic Acid, Venous 2.05 (*)    All other components within normal limits  ETHANOL  SALICYLATE LEVEL    EKG  EKG Interpretation  Date/Time:  Wednesday October 20 2017 07:42:48 EST Ventricular Rate:  128 PR Interval:    QRS Duration: 81 QT Interval:  363 QTC Calculation: 530 R Axis:   82 Text Interpretation:  Sinus tachycardia Ventricular premature complex Nonspecific T abnormalities, lateral leads Borderline ST elevation, lateral leads Prolonged QT interval Artifact in lead(s) I II III aVR aVL aVF V5 TECHNICALLY DIFFICULT Confirmed by Crista Curb [phone]) on 10/20/2017 7:54:36 AM       Radiology Dg Elbow Complete Right  Result Date: 10/20/2017 CLINICAL DATA:  Chin-tuck window.  Right forearm lacerations EXAM: RIGHT ELBOW - COMPLETE 3+ VIEW COMPARISON:  None. FINDINGS: No acute fracture dislocation. No joint effusion. Soft tissue laceration overlying the medial epicondyle. Soft tissue laceration along the volar aspect of the proximal forearm. IMPRESSION: 1.  No acute osseous injury of the right elbow. 2. Soft tissue laceration overlying the medial epicondyle. Soft tissue laceration along the volar aspect of the proximal forearm. Electronically Signed   By: Elige Ko   On: 10/20/2017 09:14   Ct Head Wo Contrast  Result Date: 10/20/2017 CLINICAL DATA:  31 year old male found down in the snow. Altered mental status. EXAM: CT HEAD WITHOUT CONTRAST CT CERVICAL SPINE WITHOUT CONTRAST TECHNIQUE: Multidetector CT imaging of the head and cervical spine was performed following the standard protocol without intravenous contrast. Multiplanar CT image reconstructions of the cervical spine were also generated. COMPARISON:  Head and cervical spine CT  01/30/2013. FINDINGS: CT HEAD FINDINGS Brain: Normal cerebral volume. No midline shift, ventriculomegaly, mass effect, evidence of mass lesion, intracranial hemorrhage or evidence of cortically based acute infarction. Gray-white matter differentiation is within normal limits throughout the brain. Vascular: No suspicious intracranial vascular hyperdensity. Skull: Skull appears stable and intact. Bone mineralization is within normal limits. Sinuses/Orbits: Visualized paranasal sinuses and mastoids are stable and well pneumatized. Other: Orbit and scalp soft tissues appear stable and negative. CT CERVICAL SPINE FINDINGS Alignment: Straightening an less reversal of cervical lordosis today compared to 2014. Cervicothoracic junction alignment is within normal limits; there is motion artifact at the T1 level. Bilateral posterior element alignment is within normal limits. Skull base and vertebrae: Visualized skull base is intact. No atlanto-occipital dissociation. No cervical spine fracture. Soft tissues and spinal canal: No prevertebral fluid or swelling. No  visible canal hematoma. Small volume retained secretions in the hypopharynx. Otherwise negative noncontrast neck soft tissues. Disc levels:  No degenerative changes identified. Upper chest: Partially visible paraseptal emphysema in the lung apices. Motion artifact at the T1 level which appears grossly intact. IMPRESSION: 1. No acute traumatic injury identified in the head or cervical spine. 2. Stable and normal noncontrast CT appearance of the brain. 3. Apical pulmonary emphysema. Electronically Signed   By: Odessa FlemingH  Hall M.D.   On: 10/20/2017 08:22   Ct Cervical Spine Wo Contrast  Result Date: 10/20/2017 CLINICAL DATA:  31 year old male found down in the snow. Altered mental status. EXAM: CT HEAD WITHOUT CONTRAST CT CERVICAL SPINE WITHOUT CONTRAST TECHNIQUE: Multidetector CT imaging of the head and cervical spine was performed following the standard protocol without  intravenous contrast. Multiplanar CT image reconstructions of the cervical spine were also generated. COMPARISON:  Head and cervical spine CT 01/30/2013. FINDINGS: CT HEAD FINDINGS Brain: Normal cerebral volume. No midline shift, ventriculomegaly, mass effect, evidence of mass lesion, intracranial hemorrhage or evidence of cortically based acute infarction. Gray-white matter differentiation is within normal limits throughout the brain. Vascular: No suspicious intracranial vascular hyperdensity. Skull: Skull appears stable and intact. Bone mineralization is within normal limits. Sinuses/Orbits: Visualized paranasal sinuses and mastoids are stable and well pneumatized. Other: Orbit and scalp soft tissues appear stable and negative. CT CERVICAL SPINE FINDINGS Alignment: Straightening an less reversal of cervical lordosis today compared to 2014. Cervicothoracic junction alignment is within normal limits; there is motion artifact at the T1 level. Bilateral posterior element alignment is within normal limits. Skull base and vertebrae: Visualized skull base is intact. No atlanto-occipital dissociation. No cervical spine fracture. Soft tissues and spinal canal: No prevertebral fluid or swelling. No visible canal hematoma. Small volume retained secretions in the hypopharynx. Otherwise negative noncontrast neck soft tissues. Disc levels:  No degenerative changes identified. Upper chest: Partially visible paraseptal emphysema in the lung apices. Motion artifact at the T1 level which appears grossly intact. IMPRESSION: 1. No acute traumatic injury identified in the head or cervical spine. 2. Stable and normal noncontrast CT appearance of the brain. 3. Apical pulmonary emphysema. Electronically Signed   By: Odessa FlemingH  Hall M.D.   On: 10/20/2017 08:22   Dg Chest Port 1 View  Result Date: 10/20/2017 CLINICAL DATA:  31 year old male found down in snow this morning. Altered mental status. EXAM: PORTABLE CHEST 1 VIEW COMPARISON:  Chest  radiographs 12/02/2007. FINDINGS: Portable AP supine view at 0742 hours. Mildly rotated to the right. Normal lung volumes. Mediastinal contours are within normal limits. Visualized tracheal air column is within normal limits. Allowing for portable technique the lungs are clear. No pneumothorax or pleural effusion identified. Moderate gaseous distension of the stomach partially visible in the upper abdomen. No acute osseous abnormality identified. IMPRESSION: 1.  No acute cardiopulmonary abnormality. 2. Gas distended stomach. Electronically Signed   By: Odessa FlemingH  Hall M.D.   On: 10/20/2017 08:01   Dg Hand Complete Right  Result Date: 10/20/2017 CLINICAL DATA:  Pain after jumping from window EXAM: RIGHT HAND - COMPLETE 3+ VIEW COMPARISON:  December 02, 2007 FINDINGS: Frontal, oblique, and lateral views were obtained. There is soft tissue swelling, most notably medially and dorsally. No radiopaque foreign body or soft tissue air evident. There is evidence of an old healed fracture of the fifth metacarpal. No acute fracture or dislocation. Joint spaces appear normal. No erosive change. IMPRESSION: Old healed fracture fifth metacarpal. No acute fracture or dislocation. No evident arthropathy.  No radiopaque foreign body or soft tissue air. There is soft tissue swelling. Electronically Signed   By: Bretta BangWilliam  Woodruff III M.D.   On: 10/20/2017 09:13    Procedures Procedures (including critical care time) CRITICAL CARE Performed by: Lavera Guiseana Duo Liu   Total critical care time: 35minutes  Critical care time was exclusive of separately billable procedures and treating other patients.  Critical care was necessary to treat or prevent imminent or life-threatening deterioration.  Critical care was time spent personally by me on the following activities: management of altered mental status of unclear etiology,  development of treatment plan with patient and/or surrogate as well as nursing, discussions with consultants,  evaluation of patient's response to treatment, examination of patient, obtaining history from patient or surrogate, ordering and performing treatments and interventions, ordering and review of laboratory studies, ordering and review of radiographic studies, pulse oximetry and re-evaluation of patient's condition.  Medications Ordered in ED Medications  ceFAZolin (ANCEF) IVPB 2g/100 mL premix (not administered)  0.9 %  sodium chloride infusion (not administered)  cephALEXin (KEFLEX) capsule 500 mg (not administered)  neomycin-bacitracin-polymyxin (NEOSPORIN) ointment (not administered)  calcium chloride 1 g in sodium chloride 0.9 % 100 mL IVPB (not administered)  sodium polystyrene (KAYEXALATE) 15 GM/60ML suspension 15 g (not administered)  Tdap (BOOSTRIX) injection 0.5 mL (0.5 mLs Intramuscular Given 10/20/17 1253)  sodium chloride 0.9 % bolus 1,000 mL (0 mLs Intravenous Stopped 10/20/17 0900)  midazolam (VERSED) injection 2 mg (2 mg Intravenous Given 10/20/17 0836)  sodium chloride 0.9 % bolus 1,000 mL (0 mLs Intravenous Stopped 10/20/17 1142)  sodium chloride 0.9 % bolus 1,000 mL (1,000 mLs Intravenous New Bag/Given 10/20/17 1258)  lidocaine-EPINEPHrine (XYLOCAINE W/EPI) 2 %-1:200000 (PF) injection 20 mL (20 mLs Intradermal Given 10/20/17 1259)     Initial Impression / Assessment and Plan / ED Course  I have reviewed the triage vital signs and the nursing notes.  Pertinent labs & imaging results that were available during my care of the patient were reviewed by me and considered in my medical decision making (see chart for details).     Patient altered, agitated on arrival. Overall GCS about 8-9. Protecting airway. No focal deficits. With laceration over upper extremities from broken glass. X-rays w/o fracture or foreign bodies. Lacerations repaired by Jaynie Crumbleatyana Kirichenko, please see separate notes for procedure. tdap updated.  Ct head and c-spine without acute traumatic injuries.     Suspect this could be related to substance abuse.  His blood work does show initial lactic acid of 11 leukocytosis of 14. Question if this is related to hypothermia and his agitation. Question seizure. No other signs of infection, and I do not think this is elevated because of sepsis.  Lactate clearing with IVF. Blood toxicology unremarkable, but UDS pending. He has mild hyperkalemia but this is also hemolysis. He is hypothermic, and bair hugger placed, due to exposure. He was given versed for agitation initially, but during ED course has improvement in his mental status.  Discussed with Dr. Mariea ClontsEmokpae who will admit to hospitalist service.    Final Clinical Impressions(s) / ED Diagnoses   Final diagnoses:  Delirium  Altered mental status, unspecified altered mental status type    ED Discharge Orders    None       Lavera GuiseLiu, Dana Duo, MD 10/20/17 [id number]

## 2017-10-20 NOTE — ED Notes (Signed)
Pt waking up occasionally and asking for his belongings.  Still unaware of circumstances that brought him here.

## 2017-10-20 NOTE — ED Triage Notes (Signed)
Pt to ED via [facility] and [employer] for jumping out window (6 ft) and fighting against "the cement".  Bleeding lacerations to both arms.  Minor abrasions and lacerations all over body.  Given 5 mg IM versed en-route and became "unresponsive".  Pt nonverbal, but moving all extremities.  Unable to follow commands.  cbg 165 per  [facility].

## 2017-10-20 NOTE — ED Notes (Signed)
BP continues to drop.  MD notified.

## 2017-10-20 NOTE — ED Notes (Signed)
Call pt's girlfriend in needed Bernardo HeaterMichelle Pernell, [phone]

## 2017-10-20 NOTE — ED Notes (Signed)
Attempted report 

## 2017-10-20 NOTE — ED Notes (Signed)
Admitting at bedside 

## 2017-10-20 NOTE — H&P (Signed)
Patient Demographics:    Derrick Shepherd, is a 31 y.o. male  MRN: [id number]   DOB - April 09, 1986  Admit Date - 10/20/2017  Outpatient Primary MD for the patient is Patient, No Pcp Per   Assessment & Plan:    Principal Problem:   Altered mental state    1)Altered Mentation-suspect ingestion/inhalation of hallucinogenic street drug, however urine drug screen positive Only  for THC and benzos (Versed was administered by medical team which would explain positive benzos), continue as needed benzos for agitation, safety precautions.  Patient remains disoriented, confused. CT head w/o acute findings  2)Lacerations/Abrasions-multiple lacerations and abrasions of both upper extremities, some of them are rather deep, prophylactic IV Ancef x1 in the ED followed by p.o. Keflex and topical antibiotic cream.  Wound care consult  3)Lactic Acidosis and Leukocytosis-doubt frank sepsis or significant infection, suspect reactive leukocytosis, lactic acidosis secondary to patient being agitated, out in the cold fighting himself in the snow prolonged period of time.  Lactic acidosis improved significantly with IV fluids, continue to hydrate  4)Hypothermia- secondary  to being exposed to the elements, now resolved  5)AKI with HyperKalemia-initial creatinine 1.33, with hydration creatinine down to 1.1, potassium is 5.9, calcium chloride and Kayexalate ordered  With History of - Reviewed by me  Difficult to obtain past medical history and past surgical history due to psychosis and altered mentation, hx and  Exam is limited because patient received Versed for agitation and is now lethargic   Chief Complaint  Patient presents with  . Manic Behavior      HPI:    Derrick Shepherd  is a 31 y.o. male w/o significant past med hx per report  brought in by law enforcement due to agitation.   Difficult to obtain past medical history and past surgical history due to psychosis and altered mentation, hx and  Exam is limited because patient received Versed for agitation and is now lethargic  Level 5 caveat due to AMS.  Patient brought in by [facility] and EMS for altered mental status/agitation.  According to [employer], patient was out celebrating his birthday yesterday evening per his girlfriend.  He had endorsed marijuana to her.  He was acting weird, and she had asked him to shower, but patient subsequently ran out of the bathroom, started being on the windows and jumped out of the apartment building about 6-7 ft. He then started to fight himself in the snow. Unclear of how long patient was outside for.   Most of the hx is obtained by ED provider/ED RN, Team and EPIC records/Portions of the history of present illness (HPI) was obtained from ED team/records, reasonable attempt to verify accuracy of information taken by me  H/o is limited because patient received Versed for agitation and is now lethargic     Review of systems:    In addition to the HPI above,   Difficult to obtain past medical history and past surgical history  due to psychosis and altered mentation, hx and  Exam is limited because patient received Versed for agitation and is now lethargic    Social History:  Reviewed by me    Social History   Tobacco Use  . Smoking status: Never Smoker  . Smokeless tobacco: Never Used  Substance Use Topics  . Alcohol use: No       Family History :  Reviewed by me   No family history on file.  H/o is limited because patient received Versed for agitation and is now lethargic   Home Medications:   Prior to Admission medications   Not on File   H/o is limited because patient received Versed for agitation and is now lethargic   Allergies:    No Known Allergies   Physical Exam:   Vitals  Blood pressure 91/65, pulse (!)  101, temperature 97.9 F (36.6 C), temperature source Oral, resp. rate 19, weight 77.6 kg (171 lb), SpO2 100 %.    Exam is limited because patient received Versed for agitation and is now lethargic  Physical Examination: General appearance - lethargic after iv Versed, was agitated earlier Mental status - delirious, psychotic, agaitated Eyes - sclera anicteric Neck - supple, no JVD elevation , Chest - clear  to auscultation bilaterally, symmetrical air movement,  Heart - S1 and S2 normal,   Abdomen - soft, nontender, nondistended, no masses or organomegaly Neurological - confused, agitated, neck supple without rigidity, , Exam is limited because patient received Versed for agitation and is now lethargic Extremities - no pedal edema noted, intact peripheral pulses  Skin - Multiple Lacerations and abrasions on Rt UE, LT UE and knee areas, some Rt UE lacerations are rather deep.    Data Review:    CBC Recent Labs  Lab 10/20/17 0745 10/20/17 0749  WBC 14.1*  --   HGB 13.7 14.3  HCT 41.7 42.0  PLT 405*  --   MCV 90.7  --   MCH 29.8  --   MCHC 32.9  --   RDW 13.2  --   LYMPHSABS 3.4  --   MONOABS 1.1*  --   EOSABS 0.1  --   BASOSABS 0.0  --    ------------------------------------------------------------------------------------------------------------------  Chemistries  Recent Labs  Lab 10/20/17 0745 10/20/17 0749  NA 136 139  K 5.8* 5.9*  CL 104 106  CO2 15*  --   GLUCOSE 226* 227*  BUN 10 11  CREATININE 1.33* 1.10  CALCIUM 8.7*  --   AST 50*  --   ALT 18  --   ALKPHOS 49  --   BILITOT 0.9  --    ------------------------------------------------------------------------------------------------------------------ CrCl cannot be calculated (Unknown ideal weight.). ------------------------------------------------------------------------------------------------------------------ No results for input(s): TSH, T4TOTAL, T3FREE, THYROIDAB in the last 72 hours.  Invalid  input(s): FREET3   Coagulation profile No results for input(s): INR, PROTIME in the last 168 hours. ------------------------------------------------------------------------------------------------------------------- No results for input(s): DDIMER in the last 72 hours. -------------------------------------------------------------------------------------------------------------------  Cardiac Enzymes No results for input(s): CKMB, TROPONINI, MYOGLOBIN in the last 168 hours.  Invalid input(s): CK ------------------------------------------------------------------------------------------------------------------ No results found for: BNP   ---------------------------------------------------------------------------------------------------------------  Urinalysis    Component Value Date/Time   COLORURINE YELLOW 10/20/2017 1135   APPEARANCEUR CLEAR 10/20/2017 1135   LABSPEC 1.014 10/20/2017 1135   PHURINE 5.0 10/20/2017 1135   GLUCOSEU 150 (A) 10/20/2017 1135   HGBUR SMALL (A) 10/20/2017 1135   BILIRUBINUR NEGATIVE 10/20/2017 1135   KETONESUR NEGATIVE 10/20/2017 1135   PROTEINUR 30 (  A) 10/20/2017 1135   UROBILINOGEN 0.2 11/25/2008 0226   NITRITE NEGATIVE 10/20/2017 1135   LEUKOCYTESUR NEGATIVE 10/20/2017 1135    ----------------------------------------------------------------------------------------------------------------   Imaging Results:    Dg Elbow Complete Right  Result Date: 10/20/2017 CLINICAL DATA:  Chin-tuck window.  Right forearm lacerations EXAM: RIGHT ELBOW - COMPLETE 3+ VIEW COMPARISON:  None. FINDINGS: No acute fracture dislocation. No joint effusion. Soft tissue laceration overlying the medial epicondyle. Soft tissue laceration along the volar aspect of the proximal forearm. IMPRESSION: 1.  No acute osseous injury of the right elbow. 2. Soft tissue laceration overlying the medial epicondyle. Soft tissue laceration along the volar aspect of the proximal forearm.  Electronically Signed   By: Elige Ko   On: 10/20/2017 09:14   Ct Head Wo Contrast  Result Date: 10/20/2017 CLINICAL DATA:  31 year old male found down in the snow. Altered mental status. EXAM: CT HEAD WITHOUT CONTRAST CT CERVICAL SPINE WITHOUT CONTRAST TECHNIQUE: Multidetector CT imaging of the head and cervical spine was performed following the standard protocol without intravenous contrast. Multiplanar CT image reconstructions of the cervical spine were also generated. COMPARISON:  Head and cervical spine CT 01/30/2013. FINDINGS: CT HEAD FINDINGS Brain: Normal cerebral volume. No midline shift, ventriculomegaly, mass effect, evidence of mass lesion, intracranial hemorrhage or evidence of cortically based acute infarction. Gray-white matter differentiation is within normal limits throughout the brain. Vascular: No suspicious intracranial vascular hyperdensity. Skull: Skull appears stable and intact. Bone mineralization is within normal limits. Sinuses/Orbits: Visualized paranasal sinuses and mastoids are stable and well pneumatized. Other: Orbit and scalp soft tissues appear stable and negative. CT CERVICAL SPINE FINDINGS Alignment: Straightening an less reversal of cervical lordosis today compared to 2014. Cervicothoracic junction alignment is within normal limits; there is motion artifact at the T1 level. Bilateral posterior element alignment is within normal limits. Skull base and vertebrae: Visualized skull base is intact. No atlanto-occipital dissociation. No cervical spine fracture. Soft tissues and spinal canal: No prevertebral fluid or swelling. No visible canal hematoma. Small volume retained secretions in the hypopharynx. Otherwise negative noncontrast neck soft tissues. Disc levels:  No degenerative changes identified. Upper chest: Partially visible paraseptal emphysema in the lung apices. Motion artifact at the T1 level which appears grossly intact. IMPRESSION: 1. No acute traumatic injury  identified in the head or cervical spine. 2. Stable and normal noncontrast CT appearance of the brain. 3. Apical pulmonary emphysema. Electronically Signed   By: Odessa Fleming M.D.   On: 10/20/2017 08:22   Ct Cervical Spine Wo Contrast  Result Date: 10/20/2017 CLINICAL DATA:  31 year old male found down in the snow. Altered mental status. EXAM: CT HEAD WITHOUT CONTRAST CT CERVICAL SPINE WITHOUT CONTRAST TECHNIQUE: Multidetector CT imaging of the head and cervical spine was performed following the standard protocol without intravenous contrast. Multiplanar CT image reconstructions of the cervical spine were also generated. COMPARISON:  Head and cervical spine CT 01/30/2013. FINDINGS: CT HEAD FINDINGS Brain: Normal cerebral volume. No midline shift, ventriculomegaly, mass effect, evidence of mass lesion, intracranial hemorrhage or evidence of cortically based acute infarction. Gray-white matter differentiation is within normal limits throughout the brain. Vascular: No suspicious intracranial vascular hyperdensity. Skull: Skull appears stable and intact. Bone mineralization is within normal limits. Sinuses/Orbits: Visualized paranasal sinuses and mastoids are stable and well pneumatized. Other: Orbit and scalp soft tissues appear stable and negative. CT CERVICAL SPINE FINDINGS Alignment: Straightening an less reversal of cervical lordosis today compared to 2014. Cervicothoracic junction alignment is within normal limits; there is  motion artifact at the T1 level. Bilateral posterior element alignment is within normal limits. Skull base and vertebrae: Visualized skull base is intact. No atlanto-occipital dissociation. No cervical spine fracture. Soft tissues and spinal canal: No prevertebral fluid or swelling. No visible canal hematoma. Small volume retained secretions in the hypopharynx. Otherwise negative noncontrast neck soft tissues. Disc levels:  No degenerative changes identified. Upper chest: Partially visible  paraseptal emphysema in the lung apices. Motion artifact at the T1 level which appears grossly intact. IMPRESSION: 1. No acute traumatic injury identified in the head or cervical spine. 2. Stable and normal noncontrast CT appearance of the brain. 3. Apical pulmonary emphysema. Electronically Signed   By: Odessa FlemingH  Hall M.D.   On: 10/20/2017 08:22   Dg Chest Port 1 View  Result Date: 10/20/2017 CLINICAL DATA:  31 year old male found down in snow this morning. Altered mental status. EXAM: PORTABLE CHEST 1 VIEW COMPARISON:  Chest radiographs 12/02/2007. FINDINGS: Portable AP supine view at 0742 hours. Mildly rotated to the right. Normal lung volumes. Mediastinal contours are within normal limits. Visualized tracheal air column is within normal limits. Allowing for portable technique the lungs are clear. No pneumothorax or pleural effusion identified. Moderate gaseous distension of the stomach partially visible in the upper abdomen. No acute osseous abnormality identified. IMPRESSION: 1.  No acute cardiopulmonary abnormality. 2. Gas distended stomach. Electronically Signed   By: Odessa FlemingH  Hall M.D.   On: 10/20/2017 08:01   Dg Hand Complete Right  Result Date: 10/20/2017 CLINICAL DATA:  Pain after jumping from window EXAM: RIGHT HAND - COMPLETE 3+ VIEW COMPARISON:  December 02, 2007 FINDINGS: Frontal, oblique, and lateral views were obtained. There is soft tissue swelling, most notably medially and dorsally. No radiopaque foreign body or soft tissue air evident. There is evidence of an old healed fracture of the fifth metacarpal. No acute fracture or dislocation. Joint spaces appear normal. No erosive change. IMPRESSION: Old healed fracture fifth metacarpal. No acute fracture or dislocation. No evident arthropathy. No radiopaque foreign body or soft tissue air. There is soft tissue swelling. Electronically Signed   By: Bretta BangWilliam  Woodruff III M.D.   On: 10/20/2017 09:13    Radiological Exams on Admission: Dg Elbow Complete  Right  Result Date: 10/20/2017 CLINICAL DATA:  Chin-tuck window.  Right forearm lacerations EXAM: RIGHT ELBOW - COMPLETE 3+ VIEW COMPARISON:  None. FINDINGS: No acute fracture dislocation. No joint effusion. Soft tissue laceration overlying the medial epicondyle. Soft tissue laceration along the volar aspect of the proximal forearm. IMPRESSION: 1.  No acute osseous injury of the right elbow. 2. Soft tissue laceration overlying the medial epicondyle. Soft tissue laceration along the volar aspect of the proximal forearm. Electronically Signed   By: Elige KoHetal  Patel   On: 10/20/2017 09:14   Ct Head Wo Contrast  Result Date: 10/20/2017 CLINICAL DATA:  31 year old male found down in the snow. Altered mental status. EXAM: CT HEAD WITHOUT CONTRAST CT CERVICAL SPINE WITHOUT CONTRAST TECHNIQUE: Multidetector CT imaging of the head and cervical spine was performed following the standard protocol without intravenous contrast. Multiplanar CT image reconstructions of the cervical spine were also generated. COMPARISON:  Head and cervical spine CT 01/30/2013. FINDINGS: CT HEAD FINDINGS Brain: Normal cerebral volume. No midline shift, ventriculomegaly, mass effect, evidence of mass lesion, intracranial hemorrhage or evidence of cortically based acute infarction. Gray-white matter differentiation is within normal limits throughout the brain. Vascular: No suspicious intracranial vascular hyperdensity. Skull: Skull appears stable and intact. Bone mineralization is within normal limits.  Sinuses/Orbits: Visualized paranasal sinuses and mastoids are stable and well pneumatized. Other: Orbit and scalp soft tissues appear stable and negative. CT CERVICAL SPINE FINDINGS Alignment: Straightening an less reversal of cervical lordosis today compared to 2014. Cervicothoracic junction alignment is within normal limits; there is motion artifact at the T1 level. Bilateral posterior element alignment is within normal limits. Skull base and  vertebrae: Visualized skull base is intact. No atlanto-occipital dissociation. No cervical spine fracture. Soft tissues and spinal canal: No prevertebral fluid or swelling. No visible canal hematoma. Small volume retained secretions in the hypopharynx. Otherwise negative noncontrast neck soft tissues. Disc levels:  No degenerative changes identified. Upper chest: Partially visible paraseptal emphysema in the lung apices. Motion artifact at the T1 level which appears grossly intact. IMPRESSION: 1. No acute traumatic injury identified in the head or cervical spine. 2. Stable and normal noncontrast CT appearance of the brain. 3. Apical pulmonary emphysema. Electronically Signed   By: Odessa Fleming M.D.   On: 10/20/2017 08:22   Ct Cervical Spine Wo Contrast  Result Date: 10/20/2017 CLINICAL DATA:  32 year old male found down in the snow. Altered mental status. EXAM: CT HEAD WITHOUT CONTRAST CT CERVICAL SPINE WITHOUT CONTRAST TECHNIQUE: Multidetector CT imaging of the head and cervical spine was performed following the standard protocol without intravenous contrast. Multiplanar CT image reconstructions of the cervical spine were also generated. COMPARISON:  Head and cervical spine CT 01/30/2013. FINDINGS: CT HEAD FINDINGS Brain: Normal cerebral volume. No midline shift, ventriculomegaly, mass effect, evidence of mass lesion, intracranial hemorrhage or evidence of cortically based acute infarction. Gray-white matter differentiation is within normal limits throughout the brain. Vascular: No suspicious intracranial vascular hyperdensity. Skull: Skull appears stable and intact. Bone mineralization is within normal limits. Sinuses/Orbits: Visualized paranasal sinuses and mastoids are stable and well pneumatized. Other: Orbit and scalp soft tissues appear stable and negative. CT CERVICAL SPINE FINDINGS Alignment: Straightening an less reversal of cervical lordosis today compared to 2014. Cervicothoracic junction alignment is  within normal limits; there is motion artifact at the T1 level. Bilateral posterior element alignment is within normal limits. Skull base and vertebrae: Visualized skull base is intact. No atlanto-occipital dissociation. No cervical spine fracture. Soft tissues and spinal canal: No prevertebral fluid or swelling. No visible canal hematoma. Small volume retained secretions in the hypopharynx. Otherwise negative noncontrast neck soft tissues. Disc levels:  No degenerative changes identified. Upper chest: Partially visible paraseptal emphysema in the lung apices. Motion artifact at the T1 level which appears grossly intact. IMPRESSION: 1. No acute traumatic injury identified in the head or cervical spine. 2. Stable and normal noncontrast CT appearance of the brain. 3. Apical pulmonary emphysema. Electronically Signed   By: Odessa Fleming M.D.   On: 10/20/2017 08:22   Dg Chest Port 1 View  Result Date: 10/20/2017 CLINICAL DATA:  31 year old male found down in snow this morning. Altered mental status. EXAM: PORTABLE CHEST 1 VIEW COMPARISON:  Chest radiographs 12/02/2007. FINDINGS: Portable AP supine view at 0742 hours. Mildly rotated to the right. Normal lung volumes. Mediastinal contours are within normal limits. Visualized tracheal air column is within normal limits. Allowing for portable technique the lungs are clear. No pneumothorax or pleural effusion identified. Moderate gaseous distension of the stomach partially visible in the upper abdomen. No acute osseous abnormality identified. IMPRESSION: 1.  No acute cardiopulmonary abnormality. 2. Gas distended stomach. Electronically Signed   By: Odessa Fleming M.D.   On: 10/20/2017 08:01   Dg Hand Complete Right  Result  Date: 10/20/2017 CLINICAL DATA:  Pain after jumping from window EXAM: RIGHT HAND - COMPLETE 3+ VIEW COMPARISON:  December 02, 2007 FINDINGS: Frontal, oblique, and lateral views were obtained. There is soft tissue swelling, most notably medially and dorsally.  No radiopaque foreign body or soft tissue air evident. There is evidence of an old healed fracture of the fifth metacarpal. No acute fracture or dislocation. Joint spaces appear normal. No erosive change. IMPRESSION: Old healed fracture fifth metacarpal. No acute fracture or dislocation. No evident arthropathy. No radiopaque foreign body or soft tissue air. There is soft tissue swelling. Electronically Signed   By: Bretta Bang III M.D.   On: 10/20/2017 09:13    DVT Prophylaxis -SCD/Heparin AM Labs Ordered, also please review Full Orders  Family Communication: Admission, patients condition and plan of care including tests being ordered have been discussed with the patient  who indicate understanding and agree with the plan   Code Status - Full Code  Likely DC to  Home   Condition   fair  Shon Hale M.D on 10/20/2017 at 12:56 PM   Between 7am to 7pm - Pager - [phone] After 7pm go to [email or web address] - password [email or web address]  [facility] - Office  [phone]  Voice Recognition Reubin [employer] dictation system was used to create this note, attempts have been made to correct errors. Please contact the author with questions and/or clarifications.

## 2017-10-20 NOTE — Progress Notes (Signed)
Calcium chloride requested from pharmacy. Awaiting med.

## 2017-10-20 NOTE — Consult Note (Signed)
WOC nurse consulted for sutured incisions and "road rash" abrasions on knees and arms. Contacted bedside nurse in ED to discuss.  Care for sutured sites would continue as applied today.  Will add orders for the abrasions as well.  Re consult if needed, will not follow at this time. Thanks  Damarian Priola M.D.[employer] MSN, RN,CWOCN, CNS, CWON-AP [phone])

## 2017-10-20 NOTE — ED Provider Notes (Signed)
..Laceration Repair Date/Time: 10/20/2017 1:04 PM Performed by: Jaynie CrumbleKirichenko, Ramona Slinger, PA-C Authorized by: Jaynie CrumbleKirichenko, Ellia Knowlton, PA-C   Consent:    Consent obtained:  Verbal   Consent given by:  Patient   Risks discussed:  Infection, need for additional repair, pain and poor cosmetic result   Alternatives discussed:  No treatment Anesthesia (see MAR for exact dosages):    Anesthesia method:  Local infiltration   Local anesthetic:  Lidocaine 2% WITH epi Laceration details:    Location:  Hand   Hand location:  R palm   Length (cm):  5 Repair type:    Repair type:  Simple Pre-procedure details:    Preparation:  Patient was prepped and draped in usual sterile fashion Exploration:    Wound extent: no muscle damage noted, no nerve damage noted, no tendon damage noted and no vascular damage noted   Treatment:    Area cleansed with:  Betadine and saline   Amount of cleaning:  Standard   Irrigation solution:  Sterile saline   Irrigation method:  Syringe Skin repair:    Repair method:  Sutures   Suture size:  4-0   Suture material:  Prolene   Suture technique:  Simple interrupted   Number of sutures:  6 Approximation:    Approximation:  Close   Vermilion border: well-aligned   Post-procedure details:    Dressing:  Antibiotic ointment   Patient tolerance of procedure:  Tolerated well, no immediate complications     .Marland Kitchen.Laceration Repair Date/Time: 10/20/2017 1:04 PM Performed by: Jaynie CrumbleKirichenko, Rylah Fukuda, PA-C Authorized by: Jaynie CrumbleKirichenko, Catheline Hixon, PA-C   Consent:    Consent obtained:  Verbal   Consent given by:  Patient   Risks discussed:  Infection, need for additional repair, pain and poor cosmetic result   Alternatives discussed:  No treatment Anesthesia (see MAR for exact dosages):    Anesthesia method:  Local infiltration   Local anesthetic:  Lidocaine 2% WITH epi Laceration details:    Location:  wrist   R wrist   Length (cm):  3 Repair type:    Repair type:   Simple Pre-procedure details:    Preparation:  Patient was prepped and draped in usual sterile fashion Exploration:    Wound extent: no muscle damage noted, no nerve damage noted, no tendon damage noted and no vascular damage noted   Treatment:    Area cleansed with:  Betadine and saline   Amount of cleaning:  Standard   Irrigation solution:  Sterile saline   Irrigation method:  Syringe Skin repair:    Repair method:  Sutures   Suture size:  4-0   Suture material:  Prolene   Suture technique:  Simple interrupted   Number of sutures: 3 Approximation:    Approximation:  Close   Vermilion border: well-aligned   Post-procedure details:    Dressing:  Antibiotic ointment   Patient tolerance of procedure:  Tolerated well, no immediate complications  .Marland Kitchen.Laceration Repair Date/Time: 10/20/2017 1:04 PM Performed by: Jaynie CrumbleKirichenko, Tavius Turgeon, PA-C Authorized by: Jaynie CrumbleKirichenko, Bradon Fester, PA-C   Consent:    Consent obtained:  Verbal   Consent given by:  Patient   Risks discussed:  Infection, need for additional repair, pain and poor cosmetic result   Alternatives discussed:  No treatment Anesthesia (see MAR for exact dosages):    Anesthesia method:  Local infiltration   Local anesthetic:  Lidocaine 2% WITH epi Laceration details:    Location:  forearm  R. Forearm   Length (cm):  5 Repair type:  Repair type:  Simple Pre-procedure details:    Preparation:  Patient was prepped and draped in usual sterile fashion Exploration:    Wound extent: no muscle damage noted, no nerve damage noted, no tendon damage noted and no vascular damage noted   Treatment:    Area cleansed with:  Betadine and saline   Amount of cleaning:  Standard   Irrigation solution:  Sterile saline   Irrigation method:  Syringe Skin repair:    Repair method:  Sutures   Suture size:  4-0   Suture material:  Prolene and vicril rapid   Suture technique:  Simple interrupted   Number of sutures:  6skin 2  subcutaneous Approximation:    Approximation:  Close   Vermilion border: well-aligned   Post-procedure details:    Dressing:  Antibiotic ointment   Patient tolerance of procedure:  Tolerated well, no immediate complications    .Marland KitchenLaceration Repair Date/Time: 10/20/2017 1:04 PM Performed by: Jaynie Crumble, PA-C Authorized by: Jaynie Crumble, PA-C   Consent:    Consent obtained:  Verbal   Consent given by:  Patient   Risks discussed:  Infection, need for additional repair, pain and poor cosmetic result   Alternatives discussed:  No treatment Anesthesia (see MAR for exact dosages):    Anesthesia method:  Local infiltration   Local anesthetic:  Lidocaine 2% WITH epi Laceration details:    Location:  elbow Right elbow   Length (cm): 4 Repair type:    Repair type:  Simple Pre-procedure details:    Preparation:  Patient was prepped and draped in usual sterile fashion Exploration:    Wound extent: no muscle damage noted, no nerve damage noted, no tendon damage noted and no vascular damage noted   Treatment:    Area cleansed with:  Betadine and saline   Amount of cleaning:  Standard   Irrigation solution:  Sterile saline   Irrigation method:  Syringe Skin repair:    Repair method:  Sutures   Suture size:  4-0   Suture material:  Prolene   Suture technique:  Simple interrupted   Number of sutures: 4 Approximation:    Approximation:  Close   Vermilion border: well-aligned   Post-procedure details:    Dressing:  Antibiotic ointment   Patient tolerance of procedure:  Tolerated well, no immediate complications  .Marland KitchenLaceration Repair Date/Time: 10/20/2017 1:04 PM Performed by: Jaynie Crumble, PA-C Authorized by: Jaynie Crumble, PA-C   Consent:    Consent obtained:  Verbal   Consent given by:  Patient   Risks discussed:  Infection, need for additional repair, pain and poor cosmetic result   Alternatives discussed:  No treatment Anesthesia (see MAR for  exact dosages):    Anesthesia method:  Local infiltration   Local anesthetic:  Lidocaine 2% WITH epi Laceration details:    Location:  Forearm L forearm   Length (cm):  7 Repair type:    Repair type:  Simple Pre-procedure details:    Preparation:  Patient was prepped and draped in usual sterile fashion Exploration:    Wound extent: no muscle damage noted, no nerve damage noted, no tendon damage noted and no vascular damage noted   Treatment:    Area cleansed with:  Betadine and saline   Amount of cleaning:  Standard   Irrigation solution:  Sterile saline   Irrigation method:  Syringe Skin repair:    Repair method:  Sutures   Suture size:  4-0   Suture material:  Prolene   Suture technique:  Simple interrupted   Number of sutures:  7 Approximation:    Approximation:  Close   Vermilion border: well-aligned   Post-procedure details:    Dressing:  Antibiotic ointment   Patient tolerance of procedure:  Tolerated well, no immediate complications         Jaynie CrumbleKirichenko, Chynna Buerkle, PA-C 10/20/17 1524    Lavera GuiseLiu, Dana Duo, MD 10/20/17 1859

## 2017-10-21 DIAGNOSIS — E872 Acidosis, unspecified: Secondary | ICD-10-CM

## 2017-10-21 DIAGNOSIS — T68XXXA Hypothermia, initial encounter: Secondary | ICD-10-CM

## 2017-10-21 DIAGNOSIS — D62 Acute posthemorrhagic anemia: Secondary | ICD-10-CM

## 2017-10-21 DIAGNOSIS — G92 Toxic encephalopathy: Principal | ICD-10-CM

## 2017-10-21 DIAGNOSIS — T07XXXA Unspecified multiple injuries, initial encounter: Secondary | ICD-10-CM

## 2017-10-21 DIAGNOSIS — N179 Acute kidney failure, unspecified: Secondary | ICD-10-CM

## 2017-10-21 LAB — CBC
HCT: 29.8 % — ABNORMAL LOW (ref 39.0–52.0)
HEMOGLOBIN: 9.6 g/dL — AB (ref 13.0–17.0)
MCH: 29 pg (ref 26.0–34.0)
MCHC: 32.2 g/dL (ref 30.0–36.0)
MCV: 90 fL (ref 78.0–100.0)
Platelets: 231 10*3/uL (ref 150–400)
RBC: 3.31 MIL/uL — AB (ref 4.22–5.81)
RDW: 13.4 % (ref 11.5–15.5)
WBC: 8.4 10*3/uL (ref 4.0–10.5)

## 2017-10-21 LAB — BASIC METABOLIC PANEL
ANION GAP: 7 (ref 5–15)
BUN: 5 mg/dL — ABNORMAL LOW (ref 6–20)
CALCIUM: 8 mg/dL — AB (ref 8.9–10.3)
CHLORIDE: 111 mmol/L (ref 101–111)
CO2: 22 mmol/L (ref 22–32)
Creatinine, Ser: 1.13 mg/dL (ref 0.61–1.24)
GFR calc non Af Amer: >60 mL/min (ref >60)
GLUCOSE: 108 mg/dL — AB (ref 65–99)
Potassium: 3.7 mmol/L (ref 3.5–5.1)
Sodium: 140 mmol/L (ref 135–145)

## 2017-10-21 LAB — HEMOGLOBIN AND HEMATOCRIT, BLOOD
HCT: 29.6 % — ABNORMAL LOW (ref 39.0–52.0)
HEMOGLOBIN: 9.6 g/dL — AB (ref 13.0–17.0)

## 2017-10-21 LAB — HIV ANTIBODY (ROUTINE TESTING W REFLEX): HIV SCREEN 4TH GENERATION: NONREACTIVE

## 2017-10-21 MED ORDER — BACITRACIN-NEOMYCIN-POLYMYXIN OINTMENT TUBE
1.0000 | TOPICAL_OINTMENT | Freq: Three times a day (TID) | CUTANEOUS | 0 refills | Status: AC
Start: 2017-10-21 — End: 2017-10-28

## 2017-10-21 MED ORDER — CEPHALEXIN 500 MG PO CAPS: 500.0000 mg | ORAL_CAPSULE | Freq: Four times a day (QID) | ORAL | 0 refills | Status: AC
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
## 2017-10-21 NOTE — Discharge Instructions (Addendum)
Derrick BinningJames B Shepherd,  You were in the hospital because of confusion. Per discussion with you, this is secondary to alcohol, however, your alcohol level was undetectable. There may have been another substance involved for which you may not be aware. You required stitches and will need these taken out in about 10-14 days. Please follow-up with Urgent care or the emergency department to have these removed. Your blood count was low, but stable and is likely secondary to your injuries. If your symptoms worsen, please return immediately to the emergency department. Please keep abrasions clean and covered while they heal.

## 2017-10-21 NOTE — Care Management Note (Addendum)
Case Management Note  Patient Details  Name: Derrick Shepherd MRN: [id number] Date of Birth: 1986/02/06  Subjective/Objective:  From home, pta indep, for dc today, patient did not have time to wait for NCM to come to see him, he states he had a ride waiting for him.  He does not have insurance or PCP.  His cell phone is [phone], [facility]  Herbert SetaHeather RN states patient was dc with keflex and neosporin  ointment , which patient states he can afford.   NCM called Derrick Shepherd and he states to call his wife Derrick Shepherd and give her the information because she will make sure he get to the follow up apt, her phone is [phone].  NCM called Derrick Shepherd and gave her the information, she states she works across the street from the address.  She also state he was able to get his medications.             Action/Plan:   Expected Discharge Date:  10/21/17               Expected Discharge Plan:  Home/Self Care  In-House Referral:     Discharge planning Services  CM Consult  Post Acute Care Choice:    Choice offered to:     DME Arranged:    DME Agency:     HH Arranged:    HH Agency:     Status of Service:  Completed, signed off  If discussed at [employer] Length of Stay Meetings, dates discussed:    Additional Comments:  Leone Havenaylor, Ethon Wymer Clinton, RN 10/21/2017, 2:32 PM

## 2017-10-21 NOTE — Plan of Care (Signed)
Discharge instructions reviewed with patient and girlfriend, questions answered, verbalized understanding.  Patient gave cell phone number for case manager to call him for follow-up, patient says his girlfriend is on her break and he must leave quickly while he has a ride.  All wounds redressed by nurse prior to leaving [facility].  Patient transported to main entrance of hospital to be taken home by girlfriend.

## 2017-10-29 ENCOUNTER — Encounter: Payer: Self-pay | Admitting: Family Medicine

## 2017-10-29 ENCOUNTER — Ambulatory Visit (INDEPENDENT_AMBULATORY_CARE_PROVIDER_SITE_OTHER): Payer: Self-pay | Admitting: Family Medicine

## 2017-10-29 VITALS — BP 115/77 | HR 76 | Temp 98.2°F | Resp 14 | Ht 70.0 in | Wt 171.0 lb

## 2017-10-29 DIAGNOSIS — R319 Hematuria, unspecified: Secondary | ICD-10-CM

## 2017-10-29 DIAGNOSIS — M25562 Pain in left knee: Secondary | ICD-10-CM

## 2017-10-29 DIAGNOSIS — D6489 Other specified anemias: Secondary | ICD-10-CM

## 2017-10-29 DIAGNOSIS — Z4802 Encounter for removal of sutures: Secondary | ICD-10-CM

## 2017-10-29 DIAGNOSIS — S41112D Laceration without foreign body of left upper arm, subsequent encounter: Secondary | ICD-10-CM

## 2017-10-29 LAB — POCT URINALYSIS DIP (DEVICE)
BILIRUBIN URINE: NEGATIVE
GLUCOSE, UA: NEGATIVE mg/dL
Hgb urine dipstick: NEGATIVE
Ketones, ur: NEGATIVE mg/dL
LEUKOCYTES UA: NEGATIVE
NITRITE: NEGATIVE
PH: 7 (ref 5.0–8.0)
Protein, ur: NEGATIVE mg/dL
Specific Gravity, Urine: 1.02 (ref 1.005–1.030)
Urobilinogen, UA: 0.2 mg/dL (ref 0.0–1.0)

## 2017-10-29 MED ORDER — IBUPROFEN 600 MG PO TABS: 600.0000 mg | ORAL_TABLET | Freq: Three times a day (TID) | ORAL | 0 refills | Status: AC | PRN

## 2017-10-29 NOTE — Progress Notes (Signed)
Subjective:    Patient ID: Derrick Shepherd, male    DOB: 1986-10-29, 31 y.o.   MRN: [id number]  HPI Derrick Shepherd, a 31 year old male presents accompanied by fiance to establish care. Patient states that he has not had a primary provider due to financial and insurance constraints. 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 Drug screen was positive for only THC and benzodiazepines.  A total of 27 sutures were placed  In upper extremities. Patient also sustained abrasions to forehead, upper and lower extremities after becoming aggitated and jumping out of a glass window. Patient endorses pain to left knee. Current pain intensity is 5/10. He denies fever, shortness of breath, fatigue, chest pain, nausea, vomiting,or diarrhea.  Social History   Socioeconomic History  . Marital status: Single    Spouse name: Not on file  . Number of children: Not on file  . Years of education: Not on file  . Highest education level: Not on file  Social Needs  . Financial resource strain: Not on file  . Food insecurity - worry: Not on file  . Food insecurity - inability: Not on file  . Transportation needs - medical: Not on file  . Transportation needs - non-medical: Not on file  Occupational History  . Not on file  Tobacco Use  . Smoking status: Former Smoker    Packs/day: 0.10    Years: 14.00    Pack years: 1.40  . Smokeless tobacco: Never Used  Substance and Sexual Activity  . Alcohol use: Yes    Comment: occ  . Drug use: No  . Sexual activity: Not on file  Other Topics Concern  . Not on file  Social History Narrative  . Not on file   Immunization History  Administered Date(s) Administered  . Tdap 04/30/2014, 10/20/2017   Review of Systems  HENT: Negative.  Negative for congestion and dental problem.   Eyes: Negative for  photophobia and visual disturbance.  Respiratory: Negative.   Cardiovascular: Negative.   Gastrointestinal: Negative.   Endocrine: Negative for polydipsia, polyphagia and polyuria.  Musculoskeletal: Negative.   Skin: Negative.        Lacerations with sutures to upper extremities and multiple abrasions  Allergic/Immunologic: Negative for environmental allergies, food allergies and immunocompromised state.  Hematological: Negative.   Psychiatric/Behavioral: Negative.        Objective:   Physical Exam  Constitutional: He is oriented to person, place, and time.  HENT:  Head: Normocephalic and atraumatic.  Right Ear: External ear normal.  Left Ear: External ear normal.  Nose: Nose normal.  Mouth/Throat: Oropharynx is clear and moist.  Eyes: Pupils are equal, round, and reactive to light.  Neck: Normal range of motion. Neck supple.  Cardiovascular: Normal rate, normal heart sounds and intact distal pulses.  Pulmonary/Chest: Effort normal and breath sounds normal.  Abdominal: Soft. Bowel sounds are normal.  Musculoskeletal: Normal range of motion.  Neurological: He is alert and oriented to person, place, and time. He has normal reflexes.  Skin: Abrasion, bruising, ecchymosis and laceration noted.     Abrasions to elbows bilaterally-80% granulated with purulent drainage  Abrasion to right forehead-100% granulated, tender to touch, no drainage  27 sutures to right and left forearms and palms.   Left Knee: 7 cm X 9 cm 60% granulation, minimal tan drainage, tender to palpation  Psychiatric: His behavior is normal. Judgment and thought content normal.  Flat affect         BP 115/77 (BP Location: Left Arm, Patient Position: Sitting, Cuff Size: Normal)   Pulse 76   Temp 98.2 F (36.8 C) (Oral)   Resp 14   Ht 5\' 10"  (1.778 m)   Wt 171 lb (77.6 kg)   SpO2 100%   BMI 24.54 kg/m  Assessment & Plan:  Visit for suture removal 24 sutures removed, cleaned with 0.9% saline  and Bacitracin applied.  Leave open to air, shower using Dial Antibacterial Soap Continue to apply Bacitracin ointment  Laceration of multiple sites of left upper extremity, subsequent encounter Sutures removed. Cleaned with 0.9% saline, bacitracin ointment applied.  Continue Keflex as prescribed.    Anemia due to other cause, not classified  - CBC  Acute pain of left knee  - ibuprofen (ADVIL,MOTRIN) 600 MG tablet; Take 1 tablet (600 mg total) by mouth every 8 (eight) hours as needed.  Dispense: 30 tablet; Refill: 0   Hematuria, unspecified type - CBC - POCT urinalysis dip (device)   RTC: Will follow up by phone with laboratory results. Return in 1 week to remove sutures from left inner forearm.    Derrick NationsLachina Moore [name]  MSN, FNP-C [facility] [address]  [place], [place] [id number] [phone]

## 2017-10-29 NOTE — Patient Instructions (Addendum)
Removed sutures to right forearm and right.  Also remove sutures from dorsal aspect of right hand without complication.  Recommend ibuprofen 600 mg every 8 hours as needed for mild to moderate pain with food.  Return in 1 week to remove sutures from right anterior forearm. Recommend wet-to-dry dressing to left knee as demonstrated.  Continue Keflex 500 mg twice daily as previously prescribed. We will follow-up by phone with any abnormal laboratory results. Laceration Care, Adult A laceration is a cut that goes through all layers of the skin. The cut also goes into the tissue that is right under the skin. Some cuts heal on their own. Others need to be closed with stitches (sutures), staples, skin adhesive strips, or wound glue. Taking care of your cut lowers your risk of infection and helps your cut to heal better. How to take care of your cut For stitches or staples:  Keep the wound clean and dry.  If you were given a bandage (dressing), you should change it at least one time per day or as told by your doctor. You should also change it if it gets wet or dirty.  Keep the wound completely dry for the first 24 hours or as told by your doctor. After that time, you may take a shower or a bath. However, make sure that the wound is not soaked in water until after the stitches or staples have been removed.  Clean the wound one time each day or as told by your doctor: ? Wash the wound with soap and water. ? Rinse the wound with water until all of the soap comes off. ? Pat the wound dry with a clean towel. Do not rub the wound.  After you clean the wound, put a thin layer of antibiotic ointment on it as told by your doctor. This ointment: ? Helps to prevent infection. ? Keeps the bandage from sticking to the wound.  Have your stitches or staples removed as told by your doctor. If your doctor used skin adhesive strips:  Keep the wound clean and dry.  If you were given a bandage, you should change it  at least one time per day or as told by your doctor. You should also change it if it gets dirty or wet.  Do not get the skin adhesive strips wet. You can take a shower or a bath, but be careful to keep the wound dry.  If the wound gets wet, pat it dry with a clean towel. Do not rub the wound.  Skin adhesive strips fall off on their own. You can trim the strips as the wound heals. Do not remove any strips that are still stuck to the wound. They will fall off after a while. If your doctor used wound glue:  Try to keep your wound dry, but you may briefly wet it in the shower or bath. Do not soak the wound in water, such as by swimming.  After you take a shower or a bath, gently pat the wound dry with a clean towel. Do not rub the wound.  Do not do any activities that will make you really sweaty until the skin glue has fallen off on its own.  Do not apply liquid, cream, or ointment medicine to your wound while the skin glue is still on.  If you were given a bandage, you should change it at least one time per day or as told by your doctor. You should also change it if it gets  dirty or wet.  If a bandage is placed over the wound, do not let the tape for the bandage touch the skin glue.  Do not pick at the glue. The skin glue usually stays on for 5-10 days. Then, it falls off of the skin. General Instructions  To help prevent scarring, make sure to cover your wound with sunscreen whenever you are outside after stitches are removed, after adhesive strips are removed, or when wound glue stays in place and the wound is healed. Make sure to wear a sunscreen of at least 30 SPF.  Take over-the-counter and prescription medicines only as told by your doctor.  If you were given antibiotic medicine or ointment, take or apply it as told by your doctor. Do not stop using the antibiotic even if your wound is getting better.  Do not scratch or pick at the wound.  Keep all follow-up visits as told by your  doctor. This is important.  Check your wound every day for signs of infection. Watch for: ? Redness, swelling, or pain. ? Fluid, blood, or pus.  Raise (elevate) the injured area above the level of your heart while you are sitting or lying down, if possible. Get help if:  You got a tetanus shot and you have any of these problems at the injection site: ? Swelling. ? Very bad pain. ? Redness. ? Bleeding.  You have a fever.  A wound that was closed breaks open.  You notice a bad smell coming from your wound or your bandage.  You notice something coming out of the wound, such as wood or glass.  Medicine does not help your pain.  You have more redness, swelling, or pain at the site of your wound.  You have fluid, blood, or pus coming from your wound.  You notice a change in the color of your skin near your wound.  You need to change the bandage often because fluid, blood, or pus is coming from the wound.  You start to have a new rash.  You start to have numbness around the wound. Get help right away if:  You have very bad swelling around the wound.  Your pain suddenly gets worse and is very bad.  You notice painful lumps near the wound or on skin that is anywhere on your body.  You have a red streak going away from your wound.  The wound is on your hand or foot and you cannot move a finger or toe like you usually can.  The wound is on your hand or foot and you notice that your fingers or toes look pale or bluish. This information is not intended to replace advice given to you by your health care provider. Make sure you discuss any questions you have with your health care provider. Document Released: 04/13/2008 Document Revised: 04/02/2016 Document Reviewed: 10/22/2014 Elsevier Interactive Patient Education  [employer]2018 [employer]

## 2017-10-30 LAB — CBC
HEMATOCRIT: 37.3 % — AB (ref 37.5–51.0)
Hemoglobin: 12.5 g/dL — ABNORMAL LOW (ref 13.0–17.7)
MCH: 29.3 pg (ref 26.6–33.0)
MCHC: 33.5 g/dL (ref 31.5–35.7)
MCV: 87 fL (ref 79–97)
Platelets: 498 10*3/uL — ABNORMAL HIGH (ref 150–379)
RBC: 4.27 x10E6/uL (ref 4.14–5.80)
RDW: 14 % (ref 12.3–15.4)
WBC: 7.4 10*3/uL (ref 3.4–10.8)

## 2017-11-05 ENCOUNTER — Ambulatory Visit (INDEPENDENT_AMBULATORY_CARE_PROVIDER_SITE_OTHER): Payer: Self-pay | Admitting: Family Medicine

## 2017-11-05 ENCOUNTER — Encounter: Payer: Self-pay | Admitting: Family Medicine

## 2017-11-05 VITALS — BP 116/72 | HR 78 | Temp 97.9°F | Resp 16 | Ht 70.0 in | Wt 168.0 lb

## 2017-11-05 DIAGNOSIS — Z4802 Encounter for removal of sutures: Secondary | ICD-10-CM

## 2017-11-05 NOTE — Progress Notes (Signed)
Subjective:    Patient ID: Derrick Shepherd, male    DOB: [birth year], 31 y.o.   MRN: [id number]  HPI Derrick Shepherd, a 31 year old male presents for suture removal to left forearm. Patient presented 1 week ago for suture removal and to establish care. 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 Drug screen was positive for only THC and benzodiazepines.  A total of 27 sutures were placed  In upper extremities and 24 were removed 1 week ago. Patient also sustained abrasions to forehead, upper and lower extremities after becoming aggitated and jumping out of a glass window.  He denies pain,  fever, shortness of breath, fatigue, chest pain, nausea, vomiting,or diarrhea.  Social History   Socioeconomic History  . Marital status: Single    Spouse name: Not on file  . Number of children: Not on file  . Years of education: Not on file  . Highest education level: Not on file  Social Needs  . Financial resource strain: Not on file  . Food insecurity - worry: Not on file  . Food insecurity - inability: Not on file  . Transportation needs - medical: Not on file  . Transportation needs - non-medical: Not on file  Occupational History  . Not on file  Tobacco Use  . Smoking status: Former Smoker    Packs/day: 0.10    Years: 14.00    Pack years: 1.40  . Smokeless tobacco: Never Used  Substance and Sexual Activity  . Alcohol use: Yes    Comment: occ  . Drug use: No  . Sexual activity: Not on file  Other Topics Concern  . Not on file  Social History Narrative  . Not on file   Immunization History  Administered Date(s) Administered  . Tdap 04/30/2014, 10/20/2017   Review of Systems  HENT: Negative.  Negative for congestion and dental problem.   Eyes: Negative for photophobia and visual disturbance.  Respiratory: Negative.     Cardiovascular: Negative.   Gastrointestinal: Negative.   Endocrine: Negative for polydipsia, polyphagia and polyuria.  Musculoskeletal: Negative.   Skin: Negative.        Lacerations with sutures to upper extremities and multiple abrasions  Allergic/Immunologic: Negative for environmental allergies, food allergies and immunocompromised state.  Hematological: Negative.   Psychiatric/Behavioral: Negative.        Objective:   Physical Exam  Constitutional: He is oriented to person, place, and time.  HENT:  Head: Normocephalic and atraumatic.  Right Ear: External ear normal.  Left Ear: External ear normal.  Nose: Nose normal.  Mouth/Throat: Oropharynx is clear and moist.  Eyes: Pupils are equal, round, and reactive to light.  Neck: Normal range of motion. Neck supple.  Cardiovascular: Normal rate, normal heart sounds and intact distal pulses.  Pulmonary/Chest: Effort normal and breath sounds normal.  Abdominal: Soft. Bowel sounds are normal.  Musculoskeletal: Normal range of motion.  Neurological: He is alert and oriented to person, place, and time. He has normal reflexes.  Skin: Abrasion, bruising, ecchymosis and laceration noted.     Abrasions to elbows bilaterally-90% granulated   Abrasion to right forehead-100% granulated,non  tender to touch, no drainage  5 sutures to left forearm, no drainage  Left Knee: 7 cm X 9 cm 80% granulation, minimal tan drainage, tender to palpation      Psychiatric: His behavior is normal. Judgment and  thought content normal.  Flat affect         BP 116/72 (BP Location: Left Arm, Patient Position: Sitting, Cuff Size: Normal)   Pulse 78   Temp 97.9 F (36.6 C) (Oral)   Resp 16   Ht 5\' 10"  (1.778 m)   Wt 168 lb (76.2 kg)   SpO2 100%   BMI 24.11 kg/m  Assessment & Plan:  Visit for suture removal 5 sutures removed from left upper forearm, cleaned with 0.9% saline and Bacitracin applied. Laceration approximated, non draining.   Leave open to air, shower using Dial Antibacterial Soap Continue to apply Bacitracin ointment  RTC: 6 months for complete physical exam or as needed   The patient was given clear instructions to go to ER or return to medical center if symptoms do not improve, worsen or new problems develop. The patient verbalized understanding.

## 2017-11-05 NOTE — Patient Instructions (Signed)
Continue to keep area clean and dry.   Laceration Care, Adult A laceration is a cut that goes through all layers of the skin. The cut also goes into the tissue that is right under the skin. Some cuts heal on their own. Others need to be closed with stitches (sutures), staples, skin adhesive strips, or wound glue. Taking care of your cut lowers your risk of infection and helps your cut to heal better. How to take care of your cut For stitches or staples:  Keep the wound clean and dry.  If you were given a bandage (dressing), you should change it at least one time per day or as told by your doctor. You should also change it if it gets wet or dirty.  Keep the wound completely dry for the first 24 hours or as told by your doctor. After that time, you may take a shower or a bath. However, make sure that the wound is not soaked in water until after the stitches or staples have been removed.  Clean the wound one time each day or as told by your doctor: ? Wash the wound with soap and water. ? Rinse the wound with water until all of the soap comes off. ? Pat the wound dry with a clean towel. Do not rub the wound.  After you clean the wound, put a thin layer of antibiotic ointment on it as told by your doctor. This ointment: ? Helps to prevent infection. ? Keeps the bandage from sticking to the wound.  Have your stitches or staples removed as told by your doctor. If your doctor used skin adhesive strips:  Keep the wound clean and dry.  If you were given a bandage, you should change it at least one time per day or as told by your doctor. You should also change it if it gets dirty or wet.  Do not get the skin adhesive strips wet. You can take a shower or a bath, but be careful to keep the wound dry.  If the wound gets wet, pat it dry with a clean towel. Do not rub the wound.  Skin adhesive strips fall off on their own. You can trim the strips as the wound heals. Do not remove any strips that are  still stuck to the wound. They will fall off after a while. If your doctor used wound glue:  Try to keep your wound dry, but you may briefly wet it in the shower or bath. Do not soak the wound in water, such as by swimming.  After you take a shower or a bath, gently pat the wound dry with a clean towel. Do not rub the wound.  Do not do any activities that will make you really sweaty until the skin glue has fallen off on its own.  Do not apply liquid, cream, or ointment medicine to your wound while the skin glue is still on.  If you were given a bandage, you should change it at least one time per day or as told by your doctor. You should also change it if it gets dirty or wet.  If a bandage is placed over the wound, do not let the tape for the bandage touch the skin glue.  Do not pick at the glue. The skin glue usually stays on for 5-10 days. Then, it falls off of the skin. General Instructions  To help prevent scarring, make sure to cover your wound with sunscreen whenever you are outside after  stitches are removed, after adhesive strips are removed, or when wound glue stays in place and the wound is healed. Make sure to wear a sunscreen of at least 30 SPF.  Take over-the-counter and prescription medicines only as told by your doctor.  If you were given antibiotic medicine or ointment, take or apply it as told by your doctor. Do not stop using the antibiotic even if your wound is getting better.  Do not scratch or pick at the wound.  Keep all follow-up visits as told by your doctor. This is important.  Check your wound every day for signs of infection. Watch for: ? Redness, swelling, or pain. ? Fluid, blood, or pus.  Raise (elevate) the injured area above the level of your heart while you are sitting or lying down, if possible. Get help if:  You got a tetanus shot and you have any of these problems at the injection site: ? Swelling. ? Very bad  pain. ? Redness. ? Bleeding.  You have a fever.  A wound that was closed breaks open.  You notice a bad smell coming from your wound or your bandage.  You notice something coming out of the wound, such as wood or glass.  Medicine does not help your pain.  You have more redness, swelling, or pain at the site of your wound.  You have fluid, blood, or pus coming from your wound.  You notice a change in the color of your skin near your wound.  You need to change the bandage often because fluid, blood, or pus is coming from the wound.  You start to have a new rash.  You start to have numbness around the wound. Get help right away if:  You have very bad swelling around the wound.  Your pain suddenly gets worse and is very bad.  You notice painful lumps near the wound or on skin that is anywhere on your body.  You have a red streak going away from your wound.  The wound is on your hand or foot and you cannot move a finger or toe like you usually can.  The wound is on your hand or foot and you notice that your fingers or toes look pale or bluish. This information is not intended to replace advice given to you by your health care provider. Make sure you discuss any questions you have with your health care provider. Document Released: 04/13/2008 Document Revised: 04/02/2016 Document Reviewed: 10/22/2014 Elsevier Interactive Patient Education  [employer]2018 [employer]

## 2018-05-06 ENCOUNTER — Ambulatory Visit: Payer: Self-pay | Admitting: Family Medicine

## 2019-09-18 ENCOUNTER — Other Ambulatory Visit: Payer: Self-pay

## 2019-09-18 DIAGNOSIS — Z20822 Contact with and (suspected) exposure to covid-19: Secondary | ICD-10-CM

## 2019-09-19 LAB — NOVEL CORONAVIRUS, NAA: SARS-CoV-2, NAA: NOT DETECTED

## 2021-11-07 ENCOUNTER — Emergency Department (HOSPITAL_COMMUNITY)
Admission: EM | Admit: 2021-11-07 | Discharge: 2021-11-07 | Disposition: A | Discharge status: Home / Self Care | Payer: Self-pay | Attending: Emergency Medicine | Admitting: Emergency Medicine

## 2021-11-07 ENCOUNTER — Encounter (HOSPITAL_COMMUNITY): Payer: Self-pay | Admitting: Emergency Medicine

## 2021-11-07 DIAGNOSIS — Z87891 Personal history of nicotine dependence: Secondary | ICD-10-CM | POA: Insufficient documentation

## 2021-11-07 DIAGNOSIS — Z202 Contact with and (suspected) exposure to infections with a predominantly sexual mode of transmission: Secondary | ICD-10-CM | POA: Insufficient documentation

## 2021-11-07 LAB — URINALYSIS, ROUTINE W REFLEX MICROSCOPIC
Bilirubin Urine: NEGATIVE
Glucose, UA: NEGATIVE mg/dL
Hgb urine dipstick: NEGATIVE
Ketones, ur: NEGATIVE mg/dL
Leukocytes,Ua: NEGATIVE
Nitrite: NEGATIVE
Protein, ur: NEGATIVE mg/dL
Specific Gravity, Urine: 1.026 (ref 1.005–1.030)
pH: 5 (ref 5.0–8.0)

## 2021-11-07 MED ORDER — STERILE WATER FOR INJECTION IJ SOLN
INTRAMUSCULAR | Status: AC
  Filled 2021-11-07: qty 10

## 2021-11-07 MED ORDER — DOXYCYCLINE HYCLATE 100 MG PO CAPS: 100.0000 mg | ORAL_CAPSULE | Freq: Two times a day (BID) | ORAL | 0 refills | Status: AC

## 2021-11-07 MED ORDER — CEFTRIAXONE SODIUM 1 G IJ SOLR
500.0000 mg | Freq: Once | INTRAMUSCULAR | Status: AC
  Administered 2021-11-07: 15:00:00 500 mg via INTRAMUSCULAR
  Filled 2021-11-07: qty 10
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
## 2021-11-07 NOTE — ED Provider Notes (Signed)
Emergency Medicine Provider Triage Evaluation Note  Derrick Shepherd , a 35 y.o. male  was evaluated in triage.  Pt complains of concern for exposure to STD.  He states that his ex-girlfriend called him yesterday and told him that she had gonorrhea.  He states last sexual intercourse with her was last week.  He is asymptomatic.  He denies dysuria, penile discharge, pain to his penis or testicles, testicular swelling.  Review of Systems  Positive: See above Negative:   Physical Exam  BP 131/81 (BP Location: Right Arm)    Pulse 76    Temp 98 F (36.7 C) (Oral)    Resp 18    SpO2 100%  Gen:   Awake, no distress   Resp:  Normal effort  MSK:   Moves extremities without difficulty  Other:  GU exam deferred  Medical Decision Making  Medically screening exam initiated at 1:55 PM.  Appropriate orders placed.  Carole Binning was informed that the remainder of the evaluation will be completed by another provider, this initial triage assessment does not replace that evaluation, and the importance of remaining in the ED until their evaluation is complete.     Cristopher Peru, PA-C 11/07/21 1355    Wynetta Fines, MD 11/07/21 [phone]

## 2021-11-07 NOTE — ED Triage Notes (Signed)
Patient here from home reporting exposure to std - gonorrhea and chlamydia for ex girlfriend.

## 2021-11-07 NOTE — Discharge Instructions (Signed)
You were seen in the emergency department today after a sexually transmitted disease exposure from a sexual partner.  While you were here we took a urine sample to test for gonorrhea and chlamydia.  Please check your MyChart for results of this.  In the meantime we will treat you with antibiotics to cover for gonorrhea and chlamydia.

## 2022-03-05 ENCOUNTER — Other Ambulatory Visit: Payer: Self-pay

## 2022-03-05 ENCOUNTER — Emergency Department (HOSPITAL_COMMUNITY)
Admission: EM | Admit: 2022-03-05 | Discharge: 2022-03-06 | Disposition: A | Discharge status: Home / Self Care | Payer: Self-pay | Attending: Emergency Medicine | Admitting: Emergency Medicine

## 2022-03-05 ENCOUNTER — Emergency Department (HOSPITAL_COMMUNITY): Payer: Self-pay

## 2022-03-05 ENCOUNTER — Encounter (HOSPITAL_COMMUNITY): Payer: Self-pay

## 2022-03-05 DIAGNOSIS — M25561 Pain in right knee: Secondary | ICD-10-CM | POA: Diagnosis present

## 2022-03-05 DIAGNOSIS — M545 Low back pain, unspecified: Secondary | ICD-10-CM | POA: Insufficient documentation

## 2022-03-05 DIAGNOSIS — M542 Cervicalgia: Secondary | ICD-10-CM | POA: Diagnosis not present

## 2022-03-05 DIAGNOSIS — Y92410 Unspecified street and highway as the place of occurrence of the external cause: Secondary | ICD-10-CM | POA: Diagnosis not present
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
## 2022-03-06 ENCOUNTER — Emergency Department (HOSPITAL_COMMUNITY): Payer: Self-pay

## 2022-03-06 MED ORDER — CYCLOBENZAPRINE HCL 10 MG PO TABS: 10.0000 mg | ORAL_TABLET | Freq: Every evening | ORAL | 0 refills | Status: AC | PRN

## 2022-03-06 MED ORDER — ACETAMINOPHEN 500 MG PO TABS
1000.0000 mg | ORAL_TABLET | Freq: Once | ORAL | Status: AC
  Administered 2022-03-06: 1000 mg via ORAL
  Filled 2022-03-06: qty 2

## 2022-03-06 NOTE — Discharge Instructions (Addendum)
Please take Ibuprofen (Advil, motrin) and Tylenol (acetaminophen) to relieve your pain.    You may take up to 600 MG (3 pills) of normal strength ibuprofen every 8 hours as needed.   You make take tylenol, up to 1,000 mg (two extra strength pills) every 8 hours as needed.   It is safe to take ibuprofen and tylenol at the same time as they work differently.   Do not take more than 3,000 mg tylenol in a 24 hour period (not more than one dose every 8 hours.  Please check all medication labels as many medications such as pain and cold medications may contain tylenol.  Do not drink alcohol while taking these medications.  Do not take other NSAID'S while taking ibuprofen (such as aleve or naproxen).  Please take ibuprofen with food to decrease stomach upset.  You are being prescribed a medication which may make you sleepy. For 24 hours after one dose please do not drive, operate heavy machinery, care for a small child with out another adult present, or perform any activities that may cause harm to you or someone else if you were to fall asleep or be impaired.   The best way to get rid of muscle pain is by taking NSAIDS, using heat, massage therapy, and gentle stretching/range of motion exercises.  
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
## 2022-05-23 ENCOUNTER — Other Ambulatory Visit: Payer: Self-pay

## 2022-05-23 ENCOUNTER — Emergency Department (HOSPITAL_COMMUNITY)
Admission: EM | Admit: 2022-05-23 | Discharge: 2022-05-23 | Disposition: A | Discharge status: Home / Self Care | Payer: Self-pay | Attending: Emergency Medicine | Admitting: Emergency Medicine

## 2022-05-23 ENCOUNTER — Encounter (HOSPITAL_COMMUNITY): Payer: Self-pay

## 2022-05-23 ENCOUNTER — Emergency Department (HOSPITAL_COMMUNITY): Payer: Self-pay

## 2022-05-23 DIAGNOSIS — R072 Precordial pain: Secondary | ICD-10-CM | POA: Insufficient documentation

## 2022-05-23 DIAGNOSIS — R0781 Pleurodynia: Secondary | ICD-10-CM

## 2022-05-23 LAB — CBC
HCT: 43 % (ref 39.0–52.0)
Hemoglobin: 14.4 g/dL (ref 13.0–17.0)
MCH: 29.4 pg (ref 26.0–34.0)
MCHC: 33.5 g/dL (ref 30.0–36.0)
MCV: 87.9 fL (ref 80.0–100.0)
Platelets: 320 10*3/uL (ref 150–400)
RBC: 4.89 MIL/uL (ref 4.22–5.81)
RDW: 12.5 % (ref 11.5–15.5)
WBC: 18.8 10*3/uL — ABNORMAL HIGH (ref 4.0–10.5)
nRBC: 0 % (ref 0.0–0.2)

## 2022-05-23 LAB — BASIC METABOLIC PANEL
Anion gap: 7 (ref 5–15)
BUN: 6 mg/dL (ref 6–20)
CO2: 21 mmol/L — ABNORMAL LOW (ref 22–32)
Calcium: 9.1 mg/dL (ref 8.9–10.3)
Chloride: 109 mmol/L (ref 98–111)
Creatinine, Ser: 0.86 mg/dL (ref 0.61–1.24)
GFR, Estimated: >60 mL/min (ref >60)
Glucose, Bld: 106 mg/dL — ABNORMAL HIGH (ref 70–99)
Potassium: 3.7 mmol/L (ref 3.5–5.1)
Sodium: 137 mmol/L (ref 135–145)

## 2022-05-23 LAB — TROPONIN I (HIGH SENSITIVITY)
Troponin I (High Sensitivity): <2 ng/L (ref <18)
Troponin I (High Sensitivity): <2 ng/L (ref <18)

## 2022-05-23 MED ORDER — AZITHROMYCIN 250 MG PO TABS: 250.0000 mg | ORAL_TABLET | Freq: Every day | ORAL | 0 refills | Status: AC

## 2022-05-23 MED ORDER — IOHEXOL 350 MG/ML SOLN
75.0000 mL | Freq: Once | INTRAVENOUS | Status: AC | PRN
  Administered 2022-05-23: 75 mL via INTRAVENOUS

## 2022-05-23 MED ORDER — SODIUM CHLORIDE (PF) 0.9 % IJ SOLN
INTRAMUSCULAR | Status: AC
  Filled 2022-05-23: qty 50

## 2022-05-23 MED ORDER — IBUPROFEN 600 MG PO TABS: 600.0000 mg | ORAL_TABLET | Freq: Three times a day (TID) | ORAL | 0 refills | Status: AC | PRN

## 2022-05-23 MED ORDER — KETOROLAC TROMETHAMINE 15 MG/ML IJ SOLN
15.0000 mg | Freq: Once | INTRAMUSCULAR | Status: AC
  Administered 2022-05-23: 15 mg via INTRAVENOUS
  Filled 2022-05-23: qty 1

## 2022-05-23 NOTE — ED Notes (Signed)
Patient transported to CT 

## 2022-05-23 NOTE — ED Triage Notes (Signed)
Patient c/o right chest pain since 0300 today. Patient denies any N/V, SOB, or dizziness.

## 2022-05-23 NOTE — Discharge Instructions (Addendum)
Return for any problem.   Take ibuprofen or Tylenol for pain  Complete all of prescribed antibiotic as instructed.

## 2022-05-23 NOTE — ED Provider Notes (Signed)
[facility]-EMERGENCY DEPT Provider Note   CSN: [id number] Arrival date & time: 05/23/22  1231     History  Chief Complaint  Patient presents with   Chest Pain    Derrick Shepherd is a 36 y.o. male.  36 year old male with prior medical history as detailed below presents for evaluation.  Patient complains of acute onset right-sided pleuritic chest pain.  Patient's pain began around 3 AM.  It woke him from sleep.  He reports significant discomfort with deep inhalation.  He denies associated nausea, vomiting, shortness of breath, fevers.  He took aspirin earlier this morning without improvement in his pain. He cannot report specific inciting injury or event.  He denies prior cardiac or pulmonary disease. He does report occasional vaping.  He denies drug use such as cocaine, marijuana, etc.     The history is provided by the patient and medical records.  Chest Pain Pain location:  Substernal area and R chest Pain quality: sharp and stabbing   Pain radiates to:  Does not radiate Pain severity:  Moderate Onset quality:  Sudden Duration:  12 hours Timing:  Constant Progression:  Unchanged Chronicity:  New Context: breathing        Home Medications Prior to Admission medications   Medication Sig Start Date End Date Taking? Authorizing Provider  cyclobenzaprine (FLEXERIL) 10 MG tablet Take 1 tablet (10 mg total) by mouth at bedtime as needed for muscle spasms. 03/06/22   Cristina Gong, PA-C  ibuprofen (ADVIL,MOTRIN) 600 MG tablet Take 1 tablet (600 mg total) by mouth every 8 (eight) hours as needed. 10/29/17   Massie Maroon, FNP      Allergies    Patient has no known allergies.    Review of Systems   Review of Systems  Cardiovascular:  Positive for chest pain.  All other systems reviewed and are negative.   Physical Exam Updated Vital Signs BP 121/69   Pulse 73   Temp 98.4 F (36.9 C) (Oral)   Resp 17   Ht 5\' 10"  (1.778 m)   Wt  81.6 kg   SpO2 100%   BMI 25.83 kg/m  Physical Exam Vitals and nursing note reviewed.  Constitutional:      General: He is not in acute distress.    Appearance: Normal appearance. He is well-developed.  HENT:     Head: Normocephalic and atraumatic.  Eyes:     Conjunctiva/sclera: Conjunctivae normal.     Pupils: Pupils are equal, round, and reactive to light.  Cardiovascular:     Rate and Rhythm: Normal rate and regular rhythm.     Heart sounds: Normal heart sounds.  Pulmonary:     Effort: Pulmonary effort is normal. No respiratory distress.     Breath sounds: Normal breath sounds.  Abdominal:     General: There is no distension.     Palpations: Abdomen is soft.     Tenderness: There is no abdominal tenderness.  Musculoskeletal:        General: No deformity. Normal range of motion.     Cervical back: Normal range of motion and neck supple.  Skin:    General: Skin is warm and dry.  Neurological:     General: No focal deficit present.     Mental Status: He is alert and oriented to person, place, and time.     ED Results / Procedures / Treatments   Labs (all labs ordered are listed, but only abnormal results are displayed) Labs  Reviewed  BASIC METABOLIC PANEL - Abnormal; Notable for the following components:      Result Value   CO2 21 (*)    Glucose, Bld 106 (*)    All other components within normal limits  CBC - Abnormal; Notable for the following components:   WBC 18.8 (*)    All other components within normal limits  TROPONIN I (HIGH SENSITIVITY)  TROPONIN I (HIGH SENSITIVITY)    EKG EKG Interpretation  Date/Time:  Saturday May 23 2022 12:38:19 EDT Ventricular Rate:  74 PR Interval:  181 QRS Duration: 75 QT Interval:  354 QTC Calculation: 393 R Axis:   70 Text Interpretation: Sinus rhythm Probable left atrial enlargement RSR' in V1 or V2, probably normal variant ST elev, probable normal early repol pattern Confirmed by Kristine Royal [phone]) on 05/23/2022  2:53:08 PM  Radiology DG Chest 2 View  Result Date: 05/23/2022 CLINICAL DATA:  Chest pain.  Right-sided chest pain, onset today. EXAM: CHEST - 2 VIEW COMPARISON:  Chest radiograph 10/20/2017 FINDINGS: The cardiomediastinal contours are normal. Low lung volumes with small apical blebs. Pulmonary vasculature is normal. No consolidation, pleural effusion, or pneumothorax. No acute osseous abnormalities are seen. IMPRESSION: No acute chest findings. Low lung volumes with small apical blebs. Electronically Signed   By: Narda Rutherford M.D.   On: 05/23/2022 13:03    Procedures Procedures    Medications Ordered in ED Medications  ketorolac (TORADOL) 15 MG/ML injection 15 mg (has no administration in time range)    ED Course/ Medical Decision Making/ A&P                           Medical Decision Making Amount and/or Complexity of Data Reviewed Labs: ordered. Radiology: ordered.  Risk Prescription drug management.    Medical Screen Complete  This patient presented to the ED with complaint of pleuritic right-sided chest pain.  This complaint involves an extensive number of treatment options. The initial differential diagnosis includes, but is not limited to, PE, pneumothorax, infection, etc.  This presentation is: Acute, Self-Limited, Previously Undiagnosed, Uncertain Prognosis, Complicated, Systemic Symptoms, and Threat to Life/Bodily Function  Patient is presenting with complaint of pleuritic right-sided chest pain.  Patient with acute onset of pain this morning.  Patient's history is not entirely consistent with pneumonia.  However, patient does have elevated white count. CT imaging does not demonstrate evidence of PE.  Patient with bullous lung changes.  Patient with possible early pneumonia on CT.  Patient offered double antibiotic coverage.  He is concerned about possible GI side effects.  He prefers single coverage with azithromycin alone.  In the interest of having the  patient complete prescribed antibiotics will provide with single coverage.  Patient does have established PCP.  Patient made aware of CT findings.  Patient does understand need for close outpatient follow-up.  Strict return precautions given and understood.     Additional history obtained:  External records from outside sources obtained and reviewed including prior ED visits and prior Inpatient records.    Lab Tests:  I ordered and personally interpreted labs.  The pertinent results include: CBC, BMP, troponin   Imaging Studies ordered:  I ordered imaging studies including chest x-ray, CT angio chest I independently visualized and interpreted obtained imaging which showed no PE, likely early pneumonia I agree with the radiologist interpretation.   Cardiac Monitoring:  The patient was maintained on a cardiac monitor.  I personally viewed and interpreted the  cardiac monitor which showed an underlying rhythm of: NSR   Medicines ordered:  I ordered medication including Toradol for pain Reevaluation of the patient after these medicines showed that the patient: improved  Problem List / ED Course:  Pleuritic chest pain, pneumonia   Reevaluation:  After the interventions noted above, I reevaluated the patient and found that they have: improved  Disposition:  After consideration of the diagnostic results and the patients response to treatment, I feel that the patent would benefit from close outpatient follow-up.          Final Clinical Impression(s) / ED Diagnoses Final diagnoses:  Pleuritic chest pain    Rx / DC Orders ED Discharge Orders          Ordered    azithromycin (ZITHROMAX) 250 MG tablet  Daily        05/23/22 1716    ibuprofen (ADVIL) 600 MG tablet  Every 8 hours PRN        05/23/22 1716              Wynetta Fines, MD 05/23/22 1729

## 2022-05-23 NOTE — ED Provider Triage Note (Signed)
Emergency Medicine Provider Triage Evaluation Note  Derrick Shepherd , a 36 y.o. male  was evaluated in triage.  Pt complains of chest pain.  Patient states that tight chest pain woke him up around 3 AM this morning.  Is been persistent since symptom onset.  He states it is exacerbated by taking a deep breath as well as moving his arm in certain positions.  He has no known cardiac or pulmonary issues.  He took aspirin this morning with minimal to no relief of symptoms.  He notes feeling nauseous around 6 AM this morning.  He denies associated shortness of breath, abdominal pain, vomiting, urinary symptoms, change in bowel habits.  Review of Systems  Positive: See above Negative:   Physical Exam  BP 121/69 (BP Location: Left Arm)   Pulse 74   Temp 98.4 F (36.9 C) (Oral)   Resp 14   Ht 5\' 10"  (1.778 m)   Wt 81.6 kg   SpO2 99%   BMI 25.83 kg/m  Gen:   Awake, no distress   Resp:  Normal effort  MSK:   Moves extremities without difficulty  Other:  No murmurs gallops or rubs appreciated on exam.  Lungs clear to auscultation.  No tenderness to palpation of the chest wall.  No reproducible pain with movement of right arm as patient stated was occurring earlier today.  Some reproducible pain when patient was taking a deep breath.  Medical Decision Making  Medically screening exam initiated at 1:08 PM.  Appropriate orders placed.  was informed that the remainder of the evaluation will be completed by another provider, this initial triage assessment does not replace that evaluation, and the importance of remaining in the ED until their evaluation is complete.     Carole Binning, Peter Garter 05/23/22 1309
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
